# Patient Record
Sex: Male | Born: 1990 | Race: White | Hispanic: No | Marital: Married | Smoking: Never smoker
Health system: Southern US, Community
[De-identification: ages and names within clinical notes are randomized; demographics above are authoritative.]

## PROBLEM LIST (undated history)

## (undated) DIAGNOSIS — K5792 Diverticulitis of intestine, part unspecified, without perforation or abscess without bleeding: Secondary | ICD-10-CM

## (undated) DIAGNOSIS — L309 Dermatitis, unspecified: Secondary | ICD-10-CM

## (undated) DIAGNOSIS — I10 Essential (primary) hypertension: Secondary | ICD-10-CM

## (undated) DIAGNOSIS — J45909 Unspecified asthma, uncomplicated: Secondary | ICD-10-CM

## (undated) DIAGNOSIS — F419 Anxiety disorder, unspecified: Secondary | ICD-10-CM

## (undated) HISTORY — DX: Dermatitis, unspecified: L30.9

## (undated) HISTORY — DX: Anxiety disorder, unspecified: F41.9

## (undated) HISTORY — DX: Diverticulitis of intestine, part unspecified, without perforation or abscess without bleeding: K57.92

## (undated) HISTORY — PX: APPENDECTOMY: SHX54

## (undated) HISTORY — DX: Unspecified asthma, uncomplicated: J45.909

## (undated) HISTORY — DX: Essential (primary) hypertension: I10

---
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
## 2015-10-06 ENCOUNTER — Telehealth: Payer: Self-pay | Admitting: Family Medicine

## 2015-10-06 MED ORDER — OSELTAMIVIR PHOSPHATE 75 MG PO CAPS: 75.0000 mg | ORAL_CAPSULE | Freq: Every day | ORAL | Status: DC

## 2015-10-06 NOTE — Telephone Encounter (Signed)
Mom diagnosed with flu. Needs prophylactic medicine. Rx sent to his pharmacy.

## 2015-11-11 ENCOUNTER — Encounter: Payer: Self-pay | Admitting: Family Medicine

## 2015-11-11 ENCOUNTER — Ambulatory Visit (INDEPENDENT_AMBULATORY_CARE_PROVIDER_SITE_OTHER): Payer: 59 | Admitting: Family Medicine

## 2015-11-11 VITALS — BP 131/92 | HR 75 | Temp 98.6°F | Ht 71.3 in | Wt 206.0 lb

## 2015-11-11 DIAGNOSIS — J029 Acute pharyngitis, unspecified: Secondary | ICD-10-CM

## 2015-11-11 MED ORDER — FIRST-DUKES MOUTHWASH MT SUSP: 5.0000 mL | OROMUCOSAL | Status: DC | PRN

## 2015-11-11 MED ORDER — HYDROCOD POLST-CPM POLST ER 10-8 MG/5ML PO SUER: 5.0000 mL | Freq: Every evening | ORAL | Status: DC | PRN

## 2015-11-11 NOTE — Progress Notes (Signed)
BP 131/92 mmHg  Pulse 75  Temp(Src) 98.6 F (37 C)  Ht 5' 11.3" (1.811 m)  Wt 206 lb (93.441 kg)  BMI 28.49 kg/m2  SpO2 96%   Subjective:    Patient ID: George Rodriguez, male    DOB: 1991/06/22, 25 y.o.   MRN: [id number]  HPI: George Rodriguez is a 25 y.o. male  Chief Complaint  Patient presents with  . Sore Throat   UPPER RESPIRATORY TRACT INFECTION Duration: 3 days Worst symptom: sore throat Fever: no Cough: yes Shortness of breath: no Wheezing: no Chest pain: no Chest tightness: no Chest congestion: no Nasal congestion: yes Runny nose: yes Post nasal drip: yes Sneezing: yes Sore throat: yes Swollen glands: yes Sinus pressure: yes Headache: no Face pain: no Toothache: no Ear pain: no  Ear pressure: no  Eyes red/itching:yes Eye drainage/crusting: no  Vomiting: no Rash: no Fatigue: no Sick contacts: no Strep contacts: no  Context: stable Recurrent sinusitis: no Relief with OTC cold/cough medications: no  Treatments attempted: aleve   Relevant past medical, surgical, family and social history reviewed and updated as indicated. Interim medical history since our last visit reviewed. Allergies and medications reviewed and updated.  Review of Systems  Constitutional: Negative.   HENT: Positive for congestion, postnasal drip, rhinorrhea, sinus pressure, sneezing and sore throat. Negative for dental problem, drooling, ear discharge, ear pain, facial swelling, hearing loss, mouth sores, nosebleeds, tinnitus, trouble swallowing and voice change.   Respiratory: Negative.   Cardiovascular: Negative.   Psychiatric/Behavioral: Negative.     Per HPI unless specifically indicated above     Objective:    BP 131/92 mmHg  Pulse 75  Temp(Src) 98.6 F (37 C)  Ht 5' 11.3" (1.811 m)  Wt 206 lb (93.441 kg)  BMI 28.49 kg/m2  SpO2 96%  Wt Readings from Last 3 Encounters:  11/11/15 206 lb (93.441 kg)    Physical Exam  Constitutional: He is oriented to person, place,  and time. He appears well-developed and well-nourished. No distress.  HENT:  Head: Normocephalic and atraumatic.  Right Ear: Hearing, tympanic membrane, external ear and ear canal normal.  Left Ear: Hearing, tympanic membrane, external ear and ear canal normal.  Nose: Mucosal edema and rhinorrhea present.  Mouth/Throat: Uvula is midline and mucous membranes are normal. Oropharyngeal exudate and posterior oropharyngeal erythema present. No posterior oropharyngeal edema or tonsillar abscesses.  Eyes: Conjunctivae and lids are normal. Right eye exhibits no discharge. Left eye exhibits no discharge. No scleral icterus.  Neck: Normal range of motion. Neck supple. No JVD present. No tracheal deviation present. No thyromegaly present.  Cardiovascular: Normal rate, regular rhythm, normal heart sounds and intact distal pulses.  Exam reveals no gallop and no friction rub.   No murmur heard. Pulmonary/Chest: Effort normal and breath sounds normal. No stridor. No respiratory distress. He has no wheezes. He has no rales. He exhibits no tenderness.  Musculoskeletal: Normal range of motion.  Lymphadenopathy:    He has cervical adenopathy.  Neurological: He is alert and oriented to person, place, and time.  Skin: Skin is warm, dry and intact. No rash noted. He is not diaphoretic. No erythema.  Psychiatric: He has a normal mood and affect. His speech is normal and behavior is normal. Judgment and thought content normal. Cognition and memory are normal.  Nursing note and vitals reviewed.   No results found for this or any previous visit.    Assessment & Plan:   Problem List Items Addressed This Visit  None    Visit Diagnoses    Sore throat    -  Primary    Will treat with [facility] magic mouth wash and tussionex for comfort. Call if not getting better or getting worse.     Relevant Orders    Rapid strep screen (not [facility])        Follow up plan: Return if symptoms worsen or fail to  improve.

## 2015-11-13 LAB — CULTURE, GROUP A STREP: Strep A Culture: NEGATIVE

## 2015-11-13 LAB — RAPID STREP SCREEN (MED CTR MEBANE ONLY): Strep Gp A Ag, IA W/Reflex: NEGATIVE

## 2017-03-10 ENCOUNTER — Emergency Department
Admission: EM | Admit: 2017-03-10 | Discharge: 2017-03-10 | Disposition: A | Discharge status: Home / Self Care | Payer: No Typology Code available for payment source | Attending: Emergency Medicine | Admitting: Emergency Medicine

## 2017-03-10 ENCOUNTER — Encounter: Payer: Self-pay | Admitting: Emergency Medicine

## 2017-03-10 ENCOUNTER — Telehealth: Payer: Self-pay | Admitting: Family Medicine

## 2017-03-10 DIAGNOSIS — Y929 Unspecified place or not applicable: Secondary | ICD-10-CM | POA: Diagnosis not present

## 2017-03-10 DIAGNOSIS — M791 Myalgia: Secondary | ICD-10-CM | POA: Insufficient documentation

## 2017-03-10 DIAGNOSIS — M542 Cervicalgia: Secondary | ICD-10-CM | POA: Diagnosis present

## 2017-03-10 DIAGNOSIS — M545 Low back pain: Secondary | ICD-10-CM | POA: Insufficient documentation

## 2017-03-10 DIAGNOSIS — W2210XA Striking against or struck by unspecified automobile airbag, initial encounter: Secondary | ICD-10-CM | POA: Insufficient documentation

## 2017-03-10 DIAGNOSIS — M7918 Myalgia, other site: Secondary | ICD-10-CM

## 2017-03-10 DIAGNOSIS — F17220 Nicotine dependence, chewing tobacco, uncomplicated: Secondary | ICD-10-CM | POA: Diagnosis not present

## 2017-03-10 DIAGNOSIS — Y9389 Activity, other specified: Secondary | ICD-10-CM | POA: Insufficient documentation

## 2017-03-10 DIAGNOSIS — Y998 Other external cause status: Secondary | ICD-10-CM | POA: Insufficient documentation

## 2017-03-10 MED ORDER — CYCLOBENZAPRINE HCL 5 MG PO TABS: 5.0000 mg | ORAL_TABLET | Freq: Three times a day (TID) | ORAL | 0 refills | Status: AC | PRN

## 2017-03-10 MED ORDER — LIDOCAINE 5 % EX PTCH: 1.0000 | MEDICATED_PATCH | Freq: Two times a day (BID) | CUTANEOUS | 0 refills | Status: DC

## 2017-03-10 MED ORDER — IBUPROFEN 800 MG PO TABS: 800.0000 mg | ORAL_TABLET | Freq: Three times a day (TID) | ORAL | 0 refills | Status: DC | PRN

## 2017-03-10 MED ORDER — LIDOCAINE 5 % EX PTCH
1.0000 | MEDICATED_PATCH | CUTANEOUS | Status: DC
  Administered 2017-03-10: 1 via TRANSDERMAL
  Filled 2017-03-10: qty 1
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
## 2017-03-10 NOTE — ED Notes (Signed)

## 2017-03-10 NOTE — Telephone Encounter (Signed)
FYI
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
## 2017-03-15 ENCOUNTER — Ambulatory Visit (INDEPENDENT_AMBULATORY_CARE_PROVIDER_SITE_OTHER): Payer: BLUE CROSS/BLUE SHIELD | Admitting: Family Medicine

## 2017-03-15 ENCOUNTER — Encounter: Payer: Self-pay | Admitting: Family Medicine

## 2017-03-15 VITALS — BP 132/88 | HR 84 | Temp 97.6°F | Ht 72.25 in | Wt 215.0 lb

## 2017-03-15 DIAGNOSIS — M545 Low back pain, unspecified: Secondary | ICD-10-CM

## 2017-03-15 DIAGNOSIS — M25532 Pain in left wrist: Secondary | ICD-10-CM

## 2017-03-15 MED ORDER — PREDNISONE 20 MG PO TABS
40.0000 mg | ORAL_TABLET | Freq: Every day | ORAL | 0 refills | Status: DC
Start: 2017-03-15 — End: 2017-07-13
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
## 2017-03-16 NOTE — Patient Instructions (Signed)
Follow up as needed

## 2017-05-05 ENCOUNTER — Encounter: Payer: Self-pay | Admitting: Family Medicine

## 2017-05-05 ENCOUNTER — Ambulatory Visit (INDEPENDENT_AMBULATORY_CARE_PROVIDER_SITE_OTHER): Payer: BLUE CROSS/BLUE SHIELD | Admitting: Family Medicine

## 2017-05-05 VITALS — BP 142/70 | HR 79 | Temp 98.2°F | Ht 72.0 in | Wt 220.4 lb

## 2017-05-05 DIAGNOSIS — R030 Elevated blood-pressure reading, without diagnosis of hypertension: Secondary | ICD-10-CM

## 2017-05-05 NOTE — Progress Notes (Signed)
   BP (!) 142/70   Pulse 79   Temp 98.2 F (36.8 C)   Ht 6' (1.829 m)   Wt 220 lb 6.4 oz (100 kg)   SpO2 99%   BMI 29.89 kg/m    Subjective:    Patient ID: George Rodriguez, male    DOB: 11-29-1990, 26 y.o.   MRN: [id number]  HPI: George Rodriguez is a 26 y.o. male  Chief Complaint  Patient presents with  . Hypertension    Went to allergist this morning, BP was around 150/100.    Patient presents today by advice of his Allergist who found a BP of 150/105 at his appt this morning. Pt asymptomatic, no HA, CP, dizziness, visual disturbances. States he was rushing around and late to the appointment. No prior personal hx of HTN, but significant fhx per mother. Does not he no longer exercises and that his diet is very poor these days. Also states he drinks lots of energy drinks and chews tobacco.   Relevant past medical, surgical, family and social history reviewed and updated as indicated. Interim medical history since our last visit reviewed. Allergies and medications reviewed and updated.  Review of Systems  Constitutional: Negative.   Respiratory: Negative.   Cardiovascular: Negative.   Musculoskeletal: Negative.   Neurological: Negative.   Psychiatric/Behavioral: Negative.     Per HPI unless specifically indicated above     Objective:    BP (!) 142/70   Pulse 79   Temp 98.2 F (36.8 C)   Ht 6' (1.829 m)   Wt 220 lb 6.4 oz (100 kg)   SpO2 99%   BMI 29.89 kg/m   Wt Readings from Last 3 Encounters:  05/05/17 220 lb 6.4 oz (100 kg)  03/15/17 215 lb (97.5 kg)  03/10/17 210 lb (95.3 kg)    Physical Exam  Constitutional: He is oriented to person, place, and time. He appears well-developed and well-nourished. No distress.  HENT:  Head: Atraumatic.  Eyes: Pupils are equal, round, and reactive to light. Conjunctivae are normal.  Neck: Normal range of motion. Neck supple.  Cardiovascular: Normal rate and normal heart sounds.   Pulmonary/Chest: Effort normal and breath  sounds normal.  Musculoskeletal: Normal range of motion.  Neurological: He is alert and oriented to person, place, and time.  Skin: Skin is warm and dry.  Psychiatric: He has a normal mood and affect. His behavior is normal.  Nursing note and vitals reviewed.     Assessment & Plan:   Problem List Items Addressed This Visit    None    Visit Diagnoses    Elevated blood pressure reading    -  Primary   Has come down to near normal range. Discussed monitoring closely without meds, diet and lifestyle changs. Will get a physical in 3 months and re-evaluate then       Follow up plan: Return in about 3 months (around 08/04/2017) for CPE, HTN.

## 2017-05-06 NOTE — Patient Instructions (Signed)
Follow up in 3 months

## 2017-07-13 ENCOUNTER — Encounter: Payer: Self-pay | Admitting: Family Medicine

## 2017-07-13 ENCOUNTER — Ambulatory Visit: Payer: BLUE CROSS/BLUE SHIELD | Admitting: Family Medicine

## 2017-07-13 VITALS — BP 156/97 | HR 90 | Temp 97.8°F | Wt 218.4 lb

## 2017-07-13 DIAGNOSIS — J069 Acute upper respiratory infection, unspecified: Secondary | ICD-10-CM | POA: Diagnosis not present

## 2017-07-13 DIAGNOSIS — J309 Allergic rhinitis, unspecified: Secondary | ICD-10-CM

## 2017-07-13 DIAGNOSIS — J45909 Unspecified asthma, uncomplicated: Secondary | ICD-10-CM | POA: Insufficient documentation

## 2017-07-13 DIAGNOSIS — J4540 Moderate persistent asthma, uncomplicated: Secondary | ICD-10-CM

## 2017-07-13 MED ORDER — AZITHROMYCIN 250 MG PO TABS: ORAL_TABLET | ORAL | 0 refills | Status: DC

## 2017-07-13 MED ORDER — HYDROCOD POLST-CPM POLST ER 10-8 MG/5ML PO SUER: 5.0000 mL | Freq: Two times a day (BID) | ORAL | 0 refills | Status: DC | PRN

## 2017-07-13 NOTE — Progress Notes (Signed)
   BP (!) 156/97   Pulse 90   Temp 97.8 F (36.6 C) (Oral)   Wt 218 lb 6.4 oz (99.1 kg)   SpO2 98%   BMI 29.62 kg/m    Subjective:    Patient ID: George Rodriguez, male    DOB: 09/29/1990, 26 y.o.   MRN: [id number]  HPI: George Rodriguez is a 26 y.o. male  Chief Complaint  Patient presents with  . Cough    pt states he has had a dry cough for about 5 days, states his girlfriend was sick about 3 weeks ago with URI   Patient presents with congestion, hacking cough, chest tightness, fatigue x 1 week. Has not been trying anything OTC for sxs. Cough is worst in mornings and nighttime. Girlfriend recently sick with same sxs. Hx of asthma and allergies, compliant with current regimen.   Relevant past medical, surgical, family and social history reviewed and updated as indicated. Interim medical history since our last visit reviewed. Allergies and medications reviewed and updated.  Review of Systems  Constitutional: Positive for fatigue.  HENT: Positive for congestion.   Eyes: Negative.   Respiratory: Positive for cough and chest tightness.   Cardiovascular: Negative.   Gastrointestinal: Negative.   Genitourinary: Negative.   Musculoskeletal: Negative.   Neurological: Negative.   Psychiatric/Behavioral: Negative.    Per HPI unless specifically indicated above     Objective:    BP (!) 156/97   Pulse 90   Temp 97.8 F (36.6 C) (Oral)   Wt 218 lb 6.4 oz (99.1 kg)   SpO2 98%   BMI 29.62 kg/m   Wt Readings from Last 3 Encounters:  07/13/17 218 lb 6.4 oz (99.1 kg)  05/05/17 220 lb 6.4 oz (100 kg)  03/15/17 215 lb (97.5 kg)    Physical Exam  Constitutional: He is oriented to person, place, and time. He appears well-developed and well-nourished. No distress.  HENT:  Head: Atraumatic.  Right Ear: External ear normal.  Left Ear: External ear normal.  Nasal mucosa and oropharynx erythematous  Eyes: Conjunctivae are normal. Pupils are equal, round, and reactive to light.  Neck:  Normal range of motion. Neck supple.  Cardiovascular: Normal rate and normal heart sounds.  Pulmonary/Chest: Effort normal and breath sounds normal. No respiratory distress. He has no wheezes.  Musculoskeletal: Normal range of motion.  Lymphadenopathy:    He has no cervical adenopathy.  Neurological: He is alert and oriented to person, place, and time.  Skin: Skin is warm and dry.  Psychiatric: He has a normal mood and affect. His behavior is normal.  Nursing note and vitals reviewed.     Assessment & Plan:   Problem List Items Addressed This Visit      Respiratory   Asthma    Lungs clear today, continue inhaler and allergy regimen. F/u if becoming wheezy or SOB      Allergic rhinitis    Continue current regimen, add plain mucinex.        Other Visit Diagnoses    Upper respiratory tract infection, unspecified type    -  Primary   Will start zpack and tussionex, delsym for daytime cough relief. Continue current asthma and allergy regimen additionally   Relevant Medications   azithromycin (ZITHROMAX) 250 MG tablet      Follow up plan: Return if symptoms worsen or fail to improve.

## 2017-07-13 NOTE — Patient Instructions (Signed)
Follow up as needed

## 2017-07-13 NOTE — Assessment & Plan Note (Signed)
Continue current regimen, add plain mucinex.

## 2017-07-13 NOTE — Assessment & Plan Note (Signed)
Lungs clear today, continue inhaler and allergy regimen. F/u if becoming wheezy or SOB

## 2017-07-19 ENCOUNTER — Encounter: Payer: Self-pay | Admitting: Family Medicine

## 2017-08-04 ENCOUNTER — Ambulatory Visit: Payer: BLUE CROSS/BLUE SHIELD | Admitting: Family Medicine

## 2018-09-08 ENCOUNTER — Ambulatory Visit: Payer: BC Managed Care – PPO | Admitting: Family Medicine

## 2018-09-08 ENCOUNTER — Encounter: Payer: Self-pay | Admitting: Family Medicine

## 2018-09-08 VITALS — BP 139/85 | HR 96 | Temp 100.7°F | Wt 220.8 lb

## 2018-09-08 DIAGNOSIS — J4541 Moderate persistent asthma with (acute) exacerbation: Secondary | ICD-10-CM

## 2018-09-08 DIAGNOSIS — J101 Influenza due to other identified influenza virus with other respiratory manifestations: Secondary | ICD-10-CM | POA: Diagnosis not present

## 2018-09-08 LAB — VERITOR FLU A/B WAIVED
INFLUENZA B: NEGATIVE
Influenza A: POSITIVE — AB

## 2018-09-08 MED ORDER — BREO ELLIPTA 100-25 MCG/INH IN AEPB: 1.0000 | INHALATION_SPRAY | Freq: Every day | RESPIRATORY_TRACT | 0 refills | Status: DC

## 2018-09-08 MED ORDER — BALOXAVIR MARBOXIL(80 MG DOSE) 2 X 40 MG PO TBPK: 80.0000 mg | ORAL_TABLET | Freq: Once | ORAL | 0 refills | Status: AC

## 2018-09-08 MED ORDER — HYDROCOD POLST-CPM POLST ER 10-8 MG/5ML PO SUER: 5.0000 mL | Freq: Two times a day (BID) | ORAL | 0 refills | Status: DC | PRN

## 2018-09-08 NOTE — Progress Notes (Signed)
BP 139/85   Pulse 96   Temp (!) 100.7 F (38.2 C) (Oral)   Wt 220 lb 12.8 oz (100.2 kg)   SpO2 95%   BMI 29.95 kg/m    Subjective:    Patient ID: George Rodriguez, male    DOB: [birth year], 28 y.o.   MRN: [id number]  HPI: George Rodriguez is a 28 y.o. male  Chief Complaint  Patient presents with  . URI    pt states he has had a cough, congestion, chills, body aches, and fever since yesterday   Sudden onset of congestion, productive cough, chills, sweats, generalized body aches, high fevers x 1 day. Taking OTC cold and flu medications with minimal relief. No recent sick contacts. Hx of allergies and asthma currently on allergy regimen and albuterol prn.   Relevant past medical, surgical, family and social history reviewed and updated as indicated. Interim medical history since our last visit reviewed. Allergies and medications reviewed and updated.  Review of Systems  Per HPI unless specifically indicated above     Objective:    BP 139/85   Pulse 96   Temp (!) 100.7 F (38.2 C) (Oral)   Wt 220 lb 12.8 oz (100.2 kg)   SpO2 95%   BMI 29.95 kg/m   Wt Readings from Last 3 Encounters:  09/08/18 220 lb 12.8 oz (100.2 kg)  07/13/17 218 lb 6.4 oz (99.1 kg)  05/05/17 220 lb 6.4 oz (100 kg)    Physical Exam Vitals signs and nursing note reviewed.  Constitutional:      Appearance: He is well-developed. He is ill-appearing.  HENT:     Head: Atraumatic.     Right Ear: External ear normal.     Left Ear: External ear normal.     Nose: Rhinorrhea present.     Mouth/Throat:     Pharynx: Posterior oropharyngeal erythema present. No oropharyngeal exudate.  Eyes:     Conjunctiva/sclera: Conjunctivae normal.     Pupils: Pupils are equal, round, and reactive to light.  Neck:     Musculoskeletal: Normal range of motion and neck supple.  Cardiovascular:     Rate and Rhythm: Normal rate and regular rhythm.  Pulmonary:     Effort: Pulmonary effort is normal. No respiratory distress.      Breath sounds: Wheezing present. No rales.  Musculoskeletal: Normal range of motion.  Lymphadenopathy:     Cervical: No cervical adenopathy.  Skin:    General: Skin is warm and dry.  Neurological:     Mental Status: He is alert and oriented to person, place, and time.  Psychiatric:        Behavior: Behavior normal.     Results for orders placed or performed in visit on 11/11/15  Rapid strep screen (not at [facility])  Result Value Ref Range   Strep Gp A Ag, IA W/Reflex Negative Negative  Culture, Group A Strep  Result Value Ref Range   Strep A Culture Negative       Assessment & Plan:   Problem List Items Addressed This Visit      Respiratory   Asthma    Exacerbated by influenza. Restart breo for the next few weeks. Continue prn albuterol      Relevant Medications   BREO ELLIPTA 100-25 MCG/INH AEPB    Other Visit Diagnoses    Influenza A    -  Primary   Tx with xofluza, tussionex, supportive home care. Sedation precautions reviewed. F/u if worsening or  not improving   Relevant Medications   Baloxavir Marboxil,80 MG Dose, (XOFLUZA) 2 x 40 MG TBPK   Other Relevant Orders   Veritor Flu A/B Waived       Follow up plan: Return if symptoms worsen or fail to improve.

## 2018-09-08 NOTE — Assessment & Plan Note (Signed)
Exacerbated by influenza. Restart breo for the next few weeks. Continue prn albuterol

## 2018-12-23 ENCOUNTER — Other Ambulatory Visit: Payer: Self-pay | Admitting: Family Medicine

## 2018-12-23 NOTE — Telephone Encounter (Signed)
Requested Prescriptions  Pending Prescriptions Disp Refills  . BREO ELLIPTA 100-25 MCG/INH AEPB [Pharmacy Med Name: BREO ELLIPTA 100-25 MCG INH] 60 each 0    Sig: TAKE 1 PUFF BY MOUTH EVERY DAY     Pulmonology:  Combination Products Passed - 12/23/2018 11:41 AM      Passed - Valid encounter within last 12 months    Recent Outpatient Visits          3 months ago Influenza A   [facility] [place], [place]   1 year ago Upper respiratory tract infection, unspecified type   [facility] Roosvelt Maser [place], [place]   1 year ago Elevated blood pressure reading   [facility], [place], [place]   1 year ago Acute left-sided low back pain without sciatica   [facility] Part[place], [place]   3 years ago Sore throat   [facility] [place], [place], [place]

## 2019-01-19 ENCOUNTER — Other Ambulatory Visit: Payer: Self-pay | Admitting: Family Medicine

## 2019-01-19 NOTE — Telephone Encounter (Signed)
Needs appointment

## 2019-01-19 NOTE — Telephone Encounter (Signed)
Pt scheduled for Tuesday

## 2019-01-19 NOTE — Telephone Encounter (Signed)
Forwarding medication refill to PCP for review. 

## 2019-01-23 ENCOUNTER — Ambulatory Visit (INDEPENDENT_AMBULATORY_CARE_PROVIDER_SITE_OTHER): Payer: BC Managed Care – PPO | Admitting: Family Medicine

## 2019-01-23 ENCOUNTER — Other Ambulatory Visit: Payer: Self-pay

## 2019-01-23 ENCOUNTER — Encounter: Payer: Self-pay | Admitting: Family Medicine

## 2019-01-23 DIAGNOSIS — J4540 Moderate persistent asthma, uncomplicated: Secondary | ICD-10-CM

## 2019-01-23 MED ORDER — BREO ELLIPTA 100-25 MCG/INH IN AEPB: INHALATION_SPRAY | RESPIRATORY_TRACT | 1 refills | Status: DC

## 2019-01-23 MED ORDER — PROAIR HFA 108 (90 BASE) MCG/ACT IN AERS: 1.0000 | INHALATION_SPRAY | Freq: Four times a day (QID) | RESPIRATORY_TRACT | 1 refills | Status: DC | PRN

## 2019-01-23 NOTE — Assessment & Plan Note (Signed)
Under good control on current regimen. Continue current regimen. Continue to monitor. Call with any concerns. Refills given.   

## 2019-01-23 NOTE — Progress Notes (Signed)
Temp 98.6 F (37 C)    Subjective:    Patient ID: George Rodriguez, male    DOB: 08/24/1990, 28 y.o.   MRN: [id number]  HPI: George Rodriguez is a 28 y.o. male  Chief Complaint  Patient presents with  . Asthma    Proair refill   ASTHMA- doing well on the breo, only needing to use his proair 1x every 1-2 weeks. Asthma status: better Satisfied with current treatment?: no Albuterol/rescue inhaler frequency: 1x every 1-2 weeks. Dyspnea frequency: rarely Wheezing frequency: rarely Cough frequency: rarely Nocturnal symptom frequency: none  Limitation of activity: no Current upper respiratory symptoms: no Visits to ER or Urgent Care in past year: no Pneumovax: Not up to Date Influenza: Up to Date  Relevant past medical, surgical, family and social history reviewed and updated as indicated. Interim medical history since our last visit reviewed. Allergies and medications reviewed and updated.  Review of Systems  Constitutional: Negative.   HENT: Negative.   Respiratory: Negative.   Cardiovascular: Negative.   Neurological: Negative.   Psychiatric/Behavioral: Negative.     Per HPI unless specifically indicated above     Objective:    Temp 98.6 F (37 C)   Wt Readings from Last 3 Encounters:  09/08/18 220 lb 12.8 oz (100.2 kg)  07/13/17 218 lb 6.4 oz (99.1 kg)  05/05/17 220 lb 6.4 oz (100 kg)    Physical Exam Vitals signs and nursing note reviewed.  Constitutional:      General: He is not in acute distress.    Appearance: Normal appearance. He is not ill-appearing, toxic-appearing or diaphoretic.  HENT:     Head: Normocephalic and atraumatic.     Right Ear: External ear normal.     Left Ear: External ear normal.     Nose: Nose normal.     Mouth/Throat:     Mouth: Mucous membranes are moist.     Pharynx: Oropharynx is clear.  Eyes:     General: No scleral icterus.       Right eye: No discharge.        Left eye: No discharge.     Conjunctiva/sclera: Conjunctivae  normal.     Pupils: Pupils are equal, round, and reactive to light.  Neck:     Musculoskeletal: Normal range of motion.  Pulmonary:     Effort: Pulmonary effort is normal. No respiratory distress.     Comments: Speaking in full sentences Musculoskeletal: Normal range of motion.  Skin:    Coloration: Skin is not jaundiced or pale.     Findings: No bruising, erythema, lesion or rash.  Neurological:     Mental Status: He is alert and oriented to person, place, and time. Mental status is at baseline.  Psychiatric:        Mood and Affect: Mood normal.        Behavior: Behavior normal.        Thought Content: Thought content normal.        Judgment: Judgment normal.     Results for orders placed or performed in visit on 09/08/18  Veritor Flu A/B Waived  Result Value Ref Range   Influenza A Positive (A) Negative   Influenza B Negative Negative      Assessment & Plan:   Problem List Items Addressed This Visit      Respiratory   Asthma    Under good control on current regimen. Continue current regimen. Continue to monitor. Call with any concerns. Refills  given.        Relevant Medications   PROAIR HFA 108 (90 Base) MCG/ACT inhaler   BREO ELLIPTA 100-25 MCG/INH AEPB       Follow up plan: Return in about 6 months (around 07/25/2019) for Physical.

## 2019-08-26 NOTE — Progress Notes (Deleted)
There were no vitals taken for this visit.   Subjective:    Patient ID: George Rodriguez, male    DOB: 09-23-1990, 29 y.o.   MRN: [id number]  HPI: George Rodriguez is a 29 y.o. male presenting on 08/27/2019 for comprehensive medical examination. Current medical complaints include:{Blank single:19197::"none","***"}  He currently lives with: Interim Problems from his last visit: {Blank single:19197::"yes","no"}  Depression Screen done today and results listed below:  No flowsheet data found.  The patient {has/does not have:19849} a history of falls. I {did/did not:19850} complete a risk assessment for falls. A plan of care for falls {was/was not:19852} documented.   Past Medical History:  Past Medical History:  Diagnosis Date  . Anxiety   . Asthma   . Eczema     Surgical History:  Past Surgical History:  Procedure Laterality Date  . APPENDECTOMY      Medications:  Current Outpatient Medications on File Prior to Visit  Medication Sig  . BREO ELLIPTA 100-25 MCG/INH AEPB INHALE 1 PUFF BY MOUTH EVERY DAY  . DUPIXENT 300 MG/2ML SOSY 2 shots a month  . PROAIR HFA 108 (90 Base) MCG/ACT inhaler Inhale 1-2 puffs into the lungs every 6 (six) hours as needed for wheezing or shortness of breath.  . triamcinolone (NASACORT) 55 MCG/ACT AERO nasal inhaler Place 2 sprays into the nose daily.   No current facility-administered medications on file prior to visit.    Allergies:  No Known Allergies  Social History:  Social History   Socioeconomic History  . Marital status: Single    Spouse name: Not on file  . Number of children: Not on file  . Years of education: Not on file  . Highest education level: Not on file  Occupational History  . Not on file  Tobacco Use  . Smoking status: Never Smoker  . Smokeless tobacco: Current User    Types: Chew  Substance and Sexual Activity  . Alcohol use: Yes    Alcohol/week: 16.0 standard drinks    Types: 12 Cans of beer, 4 Shots of liquor  per week  . Drug use: No  . Sexual activity: Never  Other Topics Concern  . Not on file  Social History Narrative  . Not on file   Social Determinants of Health   Financial Resource Strain:   . Difficulty of Paying Living Expenses: Not on file  Food Insecurity:   . Worried About Programme researcher, broadcasting/film/video in the Last Year: Not on file  . Ran Out of Food in the Last Year: Not on file  Transportation Needs:   . Lack of Transportation (Medical): Not on file  . Lack of Transportation (Non-Medical): Not on file  Physical Activity:   . Days of Exercise per Week: Not on file  . Minutes of Exercise per Session: Not on file  Stress:   . Feeling of Stress : Not on file  Social Connections:   . Frequency of Communication with Friends and Family: Not on file  . Frequency of Social Gatherings with Friends and Family: Not on file  . Attends Religious Services: Not on file  . Active Member of Clubs or Organizations: Not on file  . Attends Banker Meetings: Not on file  . Marital Status: Not on file  Intimate Partner Violence:   . Fear of Current or Ex-Partner: Not on file  . Emotionally Abused: Not on file  . Physically Abused: Not on file  . Sexually Abused: Not on  file   Social History   Tobacco Use  Smoking Status Never Smoker  Smokeless Tobacco Current User  . Types: Chew   Social History   Substance and Sexual Activity  Alcohol Use Yes  . Alcohol/week: 16.0 standard drinks  . Types: 12 Cans of beer, 4 Shots of liquor per week    Family History:  Family History  Problem Relation Age of Onset  . Hyperlipidemia Mother   . Hypertension Father   . Heart attack Paternal Grandmother   . Alcohol abuse Paternal Grandfather     Past medical history, surgical history, medications, allergies, family history and social history reviewed with patient today and changes made to appropriate areas of the chart.   Review of Systems - {ros master:310782} All other ROS negative  except what is listed above and in the HPI.      Objective:    There were no vitals taken for this visit.  Wt Readings from Last 3 Encounters:  09/08/18 220 lb 12.8 oz (100.2 kg)  07/13/17 218 lb 6.4 oz (99.1 kg)  05/05/17 220 lb 6.4 oz (100 kg)    Physical Exam  Results for orders placed or performed in visit on 09/08/18  Veritor Flu A/B Waived  Result Value Ref Range   Influenza A Positive (A) Negative   Influenza B Negative Negative      Assessment & Plan:   Problem List Items Addressed This Visit    None    Visit Diagnoses    Routine general medical examination at a health care facility    -  Primary       Discussed aspirin prophylaxis for myocardial infarction prevention and decision was {Blank single:19197::"it was not indicated","made to continue ASA","made to start ASA","made to stop ASA","that we recommended ASA, and patient refused"}  LABORATORY TESTING:  Health maintenance labs ordered today as discussed above.   The natural history of prostate cancer and ongoing controversy regarding screening and potential treatment outcomes of prostate cancer has been discussed with the patient. The meaning of a false positive PSA and a false negative PSA has been discussed. He indicates understanding of the limitations of this screening test and wishes *** to proceed with screening PSA testing.   IMMUNIZATIONS:   - Tdap: Tetanus vaccination status reviewed: {tetanus status:315746}. - Influenza: {Blank single:19197::"Up to date","Administered today","Postponed to flu season","Refused","Given elsewhere"} - Pneumovax: {Blank single:19197::"Up to date","Administered today","Not applicable","Refused","Given elsewhere"} - Prevnar: {Blank single:19197::"Up to date","Administered today","Not applicable","Refused","Given elsewhere"} - HPV: {Blank single:19197::"Up to date","Administered today","Not applicable","Refused","Given elsewhere"} - Zostavax vaccine: {Blank single:19197::"Up  to date","Administered today","Not applicable","Refused","Given elsewhere"}  SCREENING: - Colonoscopy: {Blank single:19197::"Up to date","Ordered today","Not applicable","Refused","Done elsewhere"}  Discussed with patient purpose of the colonoscopy is to detect colon cancer at curable precancerous or early stages   - AAA Screening: {Blank single:19197::"Up to date","Ordered today","Not applicable","Refused","Done elsewhere"}  -Hearing Test: {Blank single:19197::"Up to date","Ordered today","Not applicable","Refused","Done elsewhere"}  -Spirometry: {Blank single:19197::"Up to date","Ordered today","Not applicable","Refused","Done elsewhere"}   PATIENT COUNSELING:    Sexuality: Discussed sexually transmitted diseases, partner selection, use of condoms, avoidance of unintended pregnancy  and contraceptive alternatives.   Advised to avoid cigarette smoking.  I discussed with the patient that most people either abstain from alcohol or drink within safe limits (<=14/week and <=4 drinks/occasion for males, <=7/weeks and <= 3 drinks/occasion for females) and that the risk for alcohol disorders and other health effects rises proportionally with the number of drinks per week and how often a drinker exceeds daily limits.  Discussed cessation/primary prevention  of drug use and availability of treatment for abuse.   Diet: Encouraged to adjust caloric intake to maintain  or achieve ideal body weight, to reduce intake of dietary saturated fat and total fat, to limit sodium intake by avoiding high sodium foods and not adding table salt, and to maintain adequate dietary potassium and calcium preferably from fresh fruits, vegetables, and low-fat dairy products.    stressed the importance of regular exercise  Injury prevention: Discussed safety belts, safety helmets, smoke detector, smoking near bedding or upholstery.   Dental health: Discussed importance of regular tooth brushing, flossing, and dental  visits.   Follow up plan: NEXT PREVENTATIVE PHYSICAL DUE IN 1 YEAR. No follow-ups on file.

## 2019-08-27 ENCOUNTER — Encounter: Payer: Self-pay | Admitting: Family Medicine

## 2019-09-07 ENCOUNTER — Other Ambulatory Visit: Payer: Self-pay | Admitting: Family Medicine

## 2019-09-07 NOTE — Telephone Encounter (Signed)
   Notes to clinic:  Alternative Requested:[facility] WILL NOT PAY FOR BRAND PROAIR WITHOUT PRIOR APPROVAL. PLEASE GET PRIOR AUTH OR SEND NEW RX WITH DAW 0   Requested Prescriptions  Pending Prescriptions Disp Refills   albuterol (VENTOLIN HFA) 108 (90 Base) MCG/ACT inhaler [Pharmacy Med Name: ALBUTEROL HFA 90 MCG INHALER]  0    Sig: Please specify directions, refills and quantity      Pulmonology:  Beta Agonists Failed - 09/07/2019 12:08 PM      Failed - One inhaler should last at least one month. If the patient is requesting refills earlier, contact the patient to check for uncontrolled symptoms.      Passed - Valid encounter within last 12 months    Recent Outpatient Visits           7 months ago Moderate persistent asthma without complication   [facility] [place], [place], [place]   12 months ago Influenza A   [facility] Roosvelt Maser [place], [place]   2 years ago Upper respiratory tract infection, unspecified type   [facility] Roosvelt Maser [place], [place]   2 years ago Elevated blood pressure reading   [facility], [place], [place]   2 years ago Acute left-sided low back pain without sciatica   [facility] Roosvelt Maser [place], [place]

## 2019-09-07 NOTE — Telephone Encounter (Signed)
Can the generic be sent in or does he need to come in for an office visit?

## 2019-09-08 NOTE — Telephone Encounter (Signed)
Due for physical.

## 2019-09-10 ENCOUNTER — Encounter: Payer: Self-pay | Admitting: Family Medicine

## 2019-09-10 NOTE — Telephone Encounter (Signed)
Routing to provider to refuse medication.

## 2019-09-10 NOTE — Telephone Encounter (Signed)
George Rodriguez sent letter to patient.

## 2019-09-10 NOTE — Telephone Encounter (Signed)
Invalid number

## 2019-09-10 NOTE — Telephone Encounter (Signed)
LVM for pt to call back. Will also mail letter for follow up.

## 2019-09-14 ENCOUNTER — Other Ambulatory Visit: Payer: Self-pay

## 2019-09-14 ENCOUNTER — Encounter: Payer: Self-pay | Admitting: Family Medicine

## 2019-09-14 ENCOUNTER — Ambulatory Visit (INDEPENDENT_AMBULATORY_CARE_PROVIDER_SITE_OTHER): Payer: BC Managed Care – PPO | Admitting: Family Medicine

## 2019-09-14 VITALS — Wt 230.0 lb

## 2019-09-14 DIAGNOSIS — R1030 Lower abdominal pain, unspecified: Secondary | ICD-10-CM | POA: Diagnosis not present

## 2019-09-14 NOTE — Progress Notes (Signed)
Wt 230 lb (104.3 kg)   BMI 31.19 kg/m    Subjective:    Patient ID: George Rodriguez, male    DOB: 10-22-1990, 29 y.o.   MRN: [id number]  HPI: George D Beilfuss is a 29 y.o. male  Chief Complaint  Patient presents with  . Abdominal Pain    bloated x about week    . This visit was completed via WebEx due to the restrictions of the COVID-19 pandemic. All issues as above were discussed and addressed. Physical exam was done as above through visual confirmation on WebEx. If it was felt that the patient should be evaluated in the office, they were directed there. The patient verbally consented to this visit. . Location of the patient: in parked car . Location of the provider: work . Those involved with this call:  . Provider: Merrie Roof, PA-C . CMA: Lesle Chris, [place] . Front Desk/Registration: Jill Side  . Time spent on call: 15 minutes with patient face to face via video conference. More than 50% of this time was spent in counseling and coordination of care. 5 minutes total spent in review of patient's record and preparation of their chart. I verified patient identity using two factors (patient name and date of birth). Patient consents verbally to being seen via telemedicine visit today.   About a week of of lower abdominal pain radiating up to ribs in the night when he lays down flat. Also feeling bloated fairly consistently. Some constipation, denies fevers, chills, N/V/D, sick contacts, recent travel. Not trying anything OTC for sxs.   Relevant past medical, surgical, family and social history reviewed and updated as indicated. Interim medical history since our last visit reviewed. Allergies and medications reviewed and updated.  Review of Systems  Per HPI unless specifically indicated above     Objective:    Wt 230 lb (104.3 kg)   BMI 31.19 kg/m   Wt Readings from Last 3 Encounters:  09/14/19 230 lb (104.3 kg)  09/08/18 220 lb 12.8 oz (100.2 kg)  07/13/17 218 lb 6.4 oz  (99.1 kg)    Physical Exam Vitals and nursing note reviewed.  Constitutional:      General: He is not in acute distress.    Appearance: Normal appearance.  HENT:     Head: Atraumatic.     Right Ear: External ear normal.     Left Ear: External ear normal.     Nose: Nose normal. No congestion.     Mouth/Throat:     Mouth: Mucous membranes are moist.     Pharynx: Oropharynx is clear.  Eyes:     Extraocular Movements: Extraocular movements intact.     Conjunctiva/sclera: Conjunctivae normal.  Pulmonary:     Effort: Pulmonary effort is normal. No respiratory distress.  Abdominal:     Comments: Unable to perform abdominal exam due to virtual nature of visit  Musculoskeletal:        General: Normal range of motion.     Cervical back: Normal range of motion.  Skin:    General: Skin is dry.     Findings: No erythema or rash.  Neurological:     Mental Status: He is oriented to person, place, and time.  Psychiatric:        Mood and Affect: Mood normal.        Thought Content: Thought content normal.        Judgment: Judgment normal.     Results for orders placed or performed  in visit on 09/08/18  Veritor Flu A/B Waived  Result Value Ref Range   Influenza A Positive (A) Negative   Influenza B Negative Negative      Assessment & Plan:   Problem List Items Addressed This Visit    None    Visit Diagnoses    Lower abdominal pain    -  Primary   Discussed BRAT diet, probiotics, prilosec prn. Will obtain U/A and CBC next week if not feeling better with conservative measures   Relevant Orders   UA/M w/rflx Culture, Routine   CBC With Differential/Platelet       Follow up plan: Return if symptoms worsen or fail to improve.

## 2019-10-25 ENCOUNTER — Other Ambulatory Visit: Payer: Self-pay

## 2019-10-25 ENCOUNTER — Encounter: Payer: Self-pay | Admitting: Dermatology

## 2019-10-25 ENCOUNTER — Ambulatory Visit: Payer: BC Managed Care – PPO | Admitting: Dermatology

## 2019-10-25 DIAGNOSIS — L209 Atopic dermatitis, unspecified: Secondary | ICD-10-CM

## 2019-10-25 MED ORDER — DUPIXENT 300 MG/2ML ~~LOC~~ SOAJ: 300.0000 mg | SUBCUTANEOUS | 6 refills | Status: DC

## 2019-10-25 NOTE — Progress Notes (Signed)
   Follow-Up Visit   Subjective  George Rodriguez is a 29 y.o. male who presents for the following: Atopic Derm f/u (Atopic Derm pt was on Dupixent, his last injection was 10/06/19, pt has been on Dupixent for around 3 yrs, olux foam prn, no need for asthma medication since starting dupixent). Dermatitis He complains of a rash. Symptoms began 3 years ago. Patient describes the rash as clear today. Characteristics of rash and associated history: none. His previous dermatologic history includes atopic dermatitis. Family history of derm problems: unknown. Medications currently using: Dupixent, Olux foam prn. Environmental exposures or allergies resolved since starting Dupixent.  No hx of s/e or eye issues with Dupixent     The following portions of the chart were reviewed this encounter and updated as appropriate:     Review of Systems: No other skin or systemic complaints.  Objective  Well appearing patient in no apparent distress; mood and affect are within normal limits.  A focused examination was performed including arms, back. Relevant physical exam findings are noted in the Assessment and Plan.  Objective  arms, lower back: Mild erythema with mild lichenification anticubitum, similar lower back   Assessment & Plan  Atopic dermatitis, unspecified type arms, lower back  Cont Olux foam qd aa arms, back prn flares, avoid face, groin, axilla  Sample of Dupixent 300mg /71ml pen x 1 given to pt Lot [id number] exp 06/08/21  Dupilumab (DUPIXENT) 300 MG/2ML SOPN - arms, lower back  Return in about 6 months (around 04/26/2020) for AD/Dupixent f/u.   I, 04/28/2020, RMA, am acting as scribe for Ardis Rowan, MD .

## 2019-11-15 ENCOUNTER — Other Ambulatory Visit: Payer: Self-pay

## 2019-11-15 MED ORDER — DUPIXENT 300 MG/2ML ~~LOC~~ SOSY: 300.0000 mg | PREFILLED_SYRINGE | SUBCUTANEOUS | 4 refills | Status: DC

## 2020-03-27 ENCOUNTER — Other Ambulatory Visit: Payer: Self-pay | Admitting: Dermatology

## 2020-04-29 ENCOUNTER — Ambulatory Visit: Payer: BC Managed Care – PPO | Admitting: Dermatology

## 2020-06-05 ENCOUNTER — Other Ambulatory Visit: Payer: Self-pay | Admitting: Family Medicine

## 2020-06-05 NOTE — Telephone Encounter (Signed)
Requested medication (s) are due for refill today: no  Requested medication (s) are on the active medication list: yes   Future visit scheduled: no  Notes to clinic:  script is expired Overdue for follow up    Requested Prescriptions  Pending Prescriptions Disp Refills   BREO ELLIPTA 100-25 MCG/INH AEPB [Pharmacy Med Name: BREO ELLIPTA 100-25 MCG INH] 60 each 5    Sig: INHALE 1 PUFF BY MOUTH EVERY DAY      Pulmonology:  Combination Products Failed - 06/05/2020 12:40 PM      Failed - Valid encounter within last 12 months    Recent Outpatient Visits           8 months ago Lower abdominal pain   [facility] Roosvelt Maser [place], [place]   1 year ago Moderate persistent asthma without complication   [facility] [place], [place], [place]   1 year ago Influenza A   [facility] Roosvelt Maser [place], [place]   2 years ago Upper respiratory tract infection, unspecified type   [facility] [place], [place], [place]   3 years ago Elevated blood pressure reading   [facility], [place], [place]

## 2020-06-05 NOTE — Telephone Encounter (Signed)
Copied from CRM [id number]. Topic: Quick Communication - Rx Refill/Question >> Jun 05, 2020 12:45 PM Mcneil, Ja-Kwan wrote: Medication: PROAIR HFA 108 (90 Base) MCG/ACT inhaler  Has the patient contacted their pharmacy? no  Preferred Pharmacy (with phone number or street name): [id number] - [place], [place] - [address]  Phone: [phone]  Fax: [phone]  Agent: Please be advised that RX refills may take up to 3 business days. We ask that you follow-up with your pharmacy.

## 2020-06-05 NOTE — Telephone Encounter (Signed)
Requested medication (s) are due for refill today: no  Requested medication (s) are on the active medication list: yes  Last refill:  01/23/2019  Future visit scheduled: no  Notes to clinic:  Attempted to reach patient with no success. Was unable to leave a vm    Requested Prescriptions  Pending Prescriptions Disp Refills   PROAIR HFA 108 (90 Base) MCG/ACT inhaler      Sig: Inhale 1-2 puffs into the lungs every 6 (six) hours as needed for wheezing or shortness of breath.      Pulmonology:  Beta Agonists Failed - 06/05/2020 12:48 PM      Failed - One inhaler should last at least one month. If the patient is requesting refills earlier, contact the patient to check for uncontrolled symptoms.      Failed - Valid encounter within last 12 months    Recent Outpatient Visits           8 months ago Lower abdominal pain   [facility] Roosvelt Maser [place], [place]   1 year ago Moderate persistent asthma without complication   [facility] [place], [place], [place]   1 year ago Influenza A   [facility] Roosvelt Maser [place], [place]   2 years ago Upper respiratory tract infection, unspecified type   [facility] Roosvelt Maser [place], [place]   3 years ago Elevated blood pressure reading   [facility], [place], [place]

## 2020-06-09 ENCOUNTER — Other Ambulatory Visit: Payer: Self-pay | Admitting: Family Medicine

## 2020-06-09 MED ORDER — PROAIR HFA 108 (90 BASE) MCG/ACT IN AERS: 1.0000 | INHALATION_SPRAY | Freq: Four times a day (QID) | RESPIRATORY_TRACT | 0 refills | Status: DC | PRN

## 2020-06-09 NOTE — Telephone Encounter (Signed)
Requested medication (s) are due for refill today: no  Requested medication (s) are on the active medication list: yes  Last refill:  today by dr Laural Benes  Future visit scheduled:No attempted to contact patient Last OV 09/14/19 was unable to leave VM Number not in service  Notes to clinic:  Pharmacy requesting alternative.    Requested Prescriptions  Pending Prescriptions Disp Refills   albuterol (VENTOLIN HFA) 108 (90 Base) MCG/ACT inhaler [Pharmacy Med Name: ALBUTEROL HFA 90 MCG INHALER]  0      Pulmonology:  Beta Agonists Failed - 06/09/2020 12:44 PM      Failed - One inhaler should last at least one month. If the patient is requesting refills earlier, contact the patient to check for uncontrolled symptoms.      Failed - Valid encounter within last 12 months    Recent Outpatient Visits           8 months ago Lower abdominal pain   [facility] Roosvelt Maser [place], [place]   1 year ago Moderate persistent asthma without complication   [facility] [place], Megan [place], DO   1 year ago Influenza A   [facility] Roosvelt Maser [place], [place]   2 years ago Upper respiratory tract infection, unspecified type   [facility] Roosvelt Maser [place], [place]   3 years ago Elevated blood pressure reading   [facility], [place], [place]

## 2020-06-10 NOTE — Telephone Encounter (Signed)
Routing to provider. Looks like pharmacy sent the requested medication and would just like it approved and sent in.

## 2020-06-26 ENCOUNTER — Other Ambulatory Visit: Payer: Self-pay | Admitting: Dermatology

## 2020-07-01 ENCOUNTER — Other Ambulatory Visit: Payer: Self-pay | Admitting: Family Medicine

## 2020-07-01 NOTE — Telephone Encounter (Signed)
Requested medication (s) are due for refill today:no  Requested medication (s) are on the active medication list:yes   Last refill:  07/01/2020  Future visit scheduled:no  Notes to clinic:  attempted to contact patient number disconnect Overdue for office visit    Requested Prescriptions  Pending Prescriptions Disp Refills   albuterol (VENTOLIN HFA) 108 (90 Base) MCG/ACT inhaler [Pharmacy Med Name: ALBUTEROL HFA (PROVENTIL) INH] 6.7 each     Sig: INHALE 1-2 PUFFS INTO THE LUNGS EVERY 4 HOURS AS NEEDED FOR WHEEZING OR SHORTNESS OF BREATH.      Pulmonology:  Beta Agonists Failed - 07/01/2020  1:19 AM      Failed - One inhaler should last at least one month. If the patient is requesting refills earlier, contact the patient to check for uncontrolled symptoms.      Failed - Valid encounter within last 12 months    Recent Outpatient Visits           9 months ago Lower abdominal pain   [facility] Roosvelt Maser [place], [place]   1 year ago Moderate persistent asthma without complication   [facility] [place], [place], DO   1 year ago Influenza A   [facility] Roosvelt Maser [place], [place]   2 years ago Upper respiratory tract infection, unspecified type   [facility] Roosvelt Maser [place], [place]   3 years ago Elevated blood pressure reading   [facility], [place], [place]

## 2020-07-06 ENCOUNTER — Other Ambulatory Visit: Payer: Self-pay | Admitting: Family Medicine

## 2020-07-06 NOTE — Telephone Encounter (Signed)
Requested Prescriptions  Pending Prescriptions Disp Refills   BREO ELLIPTA 100-25 MCG/INH AEPB [Pharmacy Med Name: BREO ELLIPTA 100-25 MCG INH] 60 each 0    Sig: INHALE 1 PUFF BY MOUTH EVERY DAY     Pulmonology:  Combination Products Failed - 07/06/2020  8:53 AM      Failed - Valid encounter within last 12 months    Recent Outpatient Visits          9 months ago Lower abdominal pain   [facility] Roosvelt Maser [place], [place]   1 year ago Moderate persistent asthma without complication   [facility] [place], Megan [place], DO   1 year ago Influenza A   [facility] Roosvelt Maser [place], [place]   2 years ago Upper respiratory tract infection, unspecified type   [facility], [place], [place]   3 years ago Elevated blood pressure reading   [facility], [place], [place]

## 2020-07-24 ENCOUNTER — Other Ambulatory Visit: Payer: Self-pay | Admitting: Dermatology

## 2020-07-30 ENCOUNTER — Other Ambulatory Visit: Payer: Self-pay | Admitting: Dermatology

## 2020-08-11 ENCOUNTER — Telehealth: Payer: Self-pay

## 2020-08-11 NOTE — Telephone Encounter (Signed)
I returned patient's call but he didn't answer and his voicemail box is full. Patient will need to schedule an appointment to be seen before we can refill his Dupxient.

## 2020-08-12 ENCOUNTER — Other Ambulatory Visit: Payer: Self-pay | Admitting: Dermatology

## 2020-08-14 ENCOUNTER — Other Ambulatory Visit: Payer: Self-pay | Admitting: Dermatology

## 2020-08-14 ENCOUNTER — Other Ambulatory Visit: Payer: Self-pay

## 2020-08-14 DIAGNOSIS — L209 Atopic dermatitis, unspecified: Secondary | ICD-10-CM

## 2020-08-14 MED ORDER — DUPIXENT 300 MG/2ML ~~LOC~~ SOAJ: 300.0000 mg | SUBCUTANEOUS | 0 refills | Status: DC

## 2020-08-14 NOTE — Progress Notes (Signed)
Dupixent RF. Patient has reschedule for 1 month.

## 2020-08-19 ENCOUNTER — Other Ambulatory Visit: Payer: Self-pay

## 2020-08-19 DIAGNOSIS — L209 Atopic dermatitis, unspecified: Secondary | ICD-10-CM

## 2020-08-19 MED ORDER — DUPIXENT 300 MG/2ML ~~LOC~~ SOSY: 300.0000 mg | PREFILLED_SYRINGE | SUBCUTANEOUS | 0 refills | Status: DC

## 2020-08-19 NOTE — Progress Notes (Signed)
Pharmacy requested syringes instead of pens.

## 2020-09-03 ENCOUNTER — Other Ambulatory Visit: Payer: Self-pay | Admitting: Dermatology

## 2020-09-03 DIAGNOSIS — L209 Atopic dermatitis, unspecified: Secondary | ICD-10-CM

## 2020-09-16 ENCOUNTER — Ambulatory Visit: Payer: BC Managed Care – PPO | Admitting: Dermatology

## 2020-09-16 ENCOUNTER — Other Ambulatory Visit: Payer: Self-pay

## 2020-09-16 DIAGNOSIS — B352 Tinea manuum: Secondary | ICD-10-CM | POA: Diagnosis not present

## 2020-09-16 DIAGNOSIS — B351 Tinea unguium: Secondary | ICD-10-CM | POA: Diagnosis not present

## 2020-09-16 DIAGNOSIS — L209 Atopic dermatitis, unspecified: Secondary | ICD-10-CM

## 2020-09-16 MED ORDER — BRYHALI 0.01 % EX LOTN: 1.0000 "application " | TOPICAL_LOTION | Freq: Two times a day (BID) | CUTANEOUS | 1 refills | Status: DC

## 2020-09-16 MED ORDER — OPZELURA 1.5 % EX CREA: 1.0000 "application " | TOPICAL_CREAM | Freq: Two times a day (BID) | CUTANEOUS | 1 refills | Status: DC

## 2020-09-16 MED ORDER — TERBINAFINE HCL 250 MG PO TABS: 250.0000 mg | ORAL_TABLET | Freq: Every day | ORAL | 1 refills | Status: DC

## 2020-09-16 MED ORDER — DUPIXENT 300 MG/2ML ~~LOC~~ SOSY: 300.0000 mg | PREFILLED_SYRINGE | SUBCUTANEOUS | 5 refills | Status: DC

## 2020-09-16 NOTE — Patient Instructions (Signed)
Dupilumab (Dupixent) is a treatment given by injection for adults with moderate-to-severe atopic dermatitis. Goal is control of skin condition, not cure. It is given as 2 injections at the first dose followed by 1 injection ever 2 weeks thereafter.  Potential side effects include allergic reaction, herpes infections, injection site reactions and conjunctivitis (inflammation of the eyes).  The use of Dupixent requires long term medication management, including periodic office visits.  

## 2020-09-16 NOTE — Progress Notes (Signed)
Follow-Up Visit   Subjective  George Rodriguez is a 30 y.o. male who presents for the following: Follow-up (Patient here today for follow up on atopic dermatitis. Patient was using dupilumab or dupixent and was unable to get refill due to missing last appointment. ).  No side effects.  Seems to be helping his asthma- using his inhalers less.  He has flared off of medication.  He currently has Olux foam which burns when he applies it. Patient tried and failed multiple topicals- Triamcinolone cream, clobetasol ointment, elidil cream, and tacrolimus. He also has a problem with the nails on his R hand. No h/o liver disease.     The following portions of the chart were reviewed this encounter and updated as appropriate:       Objective  Well appearing patient in no apparent distress; mood and affect are within normal limits.  A focused examination was performed including face, arms, back. Relevant physical exam findings are noted in the Assessment and Plan.  Objective  bilateral arm, lower back, face: Pink scaly patches on forehead, periorbital area, antecubital area BL with lichenification   Objective  Right Hand - Anterior: Nail dystrophy all 5 nails R hand  Erythema and scale on R palm   L hand and nails are clear  Images    Assessment & Plan  Atopic dermatitis, unspecified type bilateral arm, lower back, face  Severe, with flare off of Dupixent  Atopic dermatitis - Severe, on Dupixent (biologic medication).  Atopic dermatitis (eczema) is a chronic, relapsing, pruritic condition that can significantly affect quality of life. It is often associated with allergic rhinitis and/or asthma and can require treatment with topical medications, phototherapy, or in severe cases a biologic medication called Dupixent.    Start Opzelura 1.5 % cream apply 1 application twice daily to affected areas of face and body.    Start Bryhali 0.01 % lotion apply topically twice daily to affected  areas of body. Avoid applying to face, groin, and axilla. Use as directed. Risk of skin atrophy with long-term use reviewed.  DC olux foam  Restart Dupilumab (DUPIXENT) 300 MG/2ML SOPN - arms, lower back  Dupilumab (Dupixent) is a treatment given by injection for adults with moderate-to-severe atopic dermatitis. Goal is control of skin condition, not cure. It is given as 2 injections at the first dose followed by 1 injection ever 2 weeks thereafter.  Potential side effects include allergic reaction, herpes infections, injection site reactions and conjunctivitis (inflammation of the eyes).  The use of Dupixent requires long term medication management, including periodic office visits.   Ruxolitinib Phosphate (OPZELURA) 1.5 % CREA - bilateral arm, lower back, face  Halobetasol Propionate (BRYHALI) 0.01 % LOTN - bilateral arm, lower back, face  dupilumab (DUPIXENT) 300 MG/2ML prefilled syringe - bilateral arm, lower back, face  Other Related Medications Dupilumab (DUPIXENT) 300 MG/2ML SOPN  Onychomycosis Right Hand - Anterior  With associated Tinea Manis   Lamisil 250 mg take 1 pill daily with food, 1 refill   Terbinafine Counseling  Terbinafine is an anti-fungal medicine that can be applied to the skin (over the counter) or taken by mouth (prescription) to treat fungal infections. The pill version is often used to treat fungal infections of the nails or scalp. While most people do not have any side effects from taking terbinafine pills, some possible side effects of the medicine can include taste changes, headache, loss of smell, vision changes, nausea, vomiting, or diarrhea.   Rare side effects  can include irritation of the liver, allergic reaction, or decrease in blood counts (which may show up as not feeling well or developing an infection). If you are concerned about any of these side effects, please stop the medicine and call your doctor, or in the case of an emergency such as feeling  very unwell, seek immediate medical care.          Ordered Medications: terbinafine (LAMISIL) 250 MG tablet  Return in about 2 months (around 11/14/2020) for 2 month follow up on atopic derm and nails .  I, Asher Muir, CMA, am acting as scribe for Willeen Niece, MD.  Documentation: I have reviewed the above documentation for accuracy and completeness, and I agree with the above.  Willeen Niece MD

## 2020-11-11 ENCOUNTER — Ambulatory Visit: Payer: BC Managed Care – PPO | Admitting: Dermatology

## 2021-03-18 ENCOUNTER — Telehealth: Payer: BC Managed Care – PPO | Admitting: Nurse Practitioner

## 2021-03-18 NOTE — Progress Notes (Deleted)
   There were no vitals taken for this visit.   Subjective:    Patient ID: George Rodriguez, male    DOB: May 31, 1991, 30 y.o.   MRN: [id number]  HPI: George Rodriguez is a 30 y.o. male  No chief complaint on file.  EAR PROBLEM   Relevant past medical, surgical, family and social history reviewed and updated as indicated. Interim medical history since our last visit reviewed. Allergies and medications reviewed and updated.  Review of Systems  Per HPI unless specifically indicated above     Objective:    There were no vitals taken for this visit.  Wt Readings from Last 3 Encounters:  09/14/19 230 lb (104.3 kg)  09/08/18 220 lb 12.8 oz (100.2 kg)  07/13/17 218 lb 6.4 oz (99.1 kg)    Physical Exam  Results for orders placed or performed in visit on 09/08/18  Veritor Flu A/B Waived  Result Value Ref Range   Influenza A Positive (A) Negative   Influenza B Negative Negative      Assessment & Plan:   Problem List Items Addressed This Visit   None    Follow up plan: No follow-ups on file.

## 2021-04-09 ENCOUNTER — Other Ambulatory Visit: Payer: Self-pay | Admitting: Dermatology

## 2021-04-09 DIAGNOSIS — L209 Atopic dermatitis, unspecified: Secondary | ICD-10-CM

## 2021-06-10 ENCOUNTER — Other Ambulatory Visit: Payer: Self-pay | Admitting: Dermatology

## 2021-06-10 DIAGNOSIS — L209 Atopic dermatitis, unspecified: Secondary | ICD-10-CM

## 2021-07-13 ENCOUNTER — Other Ambulatory Visit: Payer: Self-pay | Admitting: Dermatology

## 2021-07-13 DIAGNOSIS — L209 Atopic dermatitis, unspecified: Secondary | ICD-10-CM

## 2021-07-19 NOTE — Progress Notes (Signed)
BP 118/78    Subjective:    Patient ID: George Rodriguez, male    DOB: 28-Jan-1991, 30 y.o.   MRN: [id number]  HPI: George D Boylan is a 30 y.o. male  Chief Complaint  Patient presents with   Covid Positive    Pt states he tested positive for covid last Monday, 07/13/21. States he needs a work note for last week, Monday through Friday.    COVID POSITIVE Patient tested positive for COVID last Monday 07/13/2021.  He needs a letter for work for 07/13/2021-07/17/2021.  He tested negative twice on Saturday and once on Sunday. Denies fever, sob, or cough.   Denies HA, CP, SOB, dizziness, palpitations, visual changes, and lower extremity swelling.  Relevant past medical, surgical, family and social history reviewed and updated as indicated. Interim medical history since our last visit reviewed. Allergies and medications reviewed and updated.  Review of Systems  Constitutional:  Negative for fever.  Eyes:  Negative for visual disturbance.  Respiratory:  Negative for cough and shortness of breath.   Cardiovascular:  Negative for chest pain and leg swelling.  Neurological:  Negative for light-headedness and headaches.   Per HPI unless specifically indicated above     Objective:    BP 118/78   Wt Readings from Last 3 Encounters:  09/14/19 230 lb (104.3 kg)  09/08/18 220 lb 12.8 oz (100.2 kg)  07/13/17 218 lb 6.4 oz (99.1 kg)    Physical Exam Vitals and nursing note reviewed.  Constitutional:      General: He is not in acute distress.    Appearance: He is not ill-appearing.  HENT:     Head: Normocephalic.     Right Ear: Hearing normal.     Left Ear: Hearing normal.     Nose: Nose normal.  Pulmonary:     Effort: Pulmonary effort is normal. No respiratory distress.  Neurological:     Mental Status: He is alert.  Psychiatric:        Mood and Affect: Mood normal.        Behavior: Behavior normal.        Thought Content: Thought content normal.        Judgment: Judgment normal.     Results for orders placed or performed in visit on 09/08/18  Veritor Flu A/B Waived  Result Value Ref Range   Influenza A Positive (A) Negative   Influenza B Negative Negative      Assessment & Plan:   Problem List Items Addressed This Visit   None Visit Diagnoses     COVID-19    -  Primary   Symptoms have resolved. Needs letter for work. Letter written for patient. He completed quarantine on Friday. Can return to work today.         Follow up plan: No follow-ups on file.   This visit was completed via MyChart due to the restrictions of the COVID-19 pandemic. All issues as above were discussed and addressed. Physical exam was done as above through visual confirmation on MyChart. If it was felt that the patient should be evaluated in the office, they were directed there. The patient verbally consented to this visit. Location of the patient: Work Location of the provider: Office Those involved with this call:  Provider: Larae Grooms, NP CMA: Wilhemena Durie, CMA Front Desk/Registration: Channing Mutters This encounter was conducted via video.  I spent 15 dedicated to the care of this patient on the date of this encounter to include  previsit review of 20, face to face time with the patient, and post visit ordering of testing.

## 2021-07-20 ENCOUNTER — Telehealth (INDEPENDENT_AMBULATORY_CARE_PROVIDER_SITE_OTHER): Payer: Self-pay | Admitting: Nurse Practitioner

## 2021-07-20 ENCOUNTER — Encounter: Payer: Self-pay | Admitting: Nurse Practitioner

## 2021-07-20 VITALS — BP 118/78

## 2021-07-20 DIAGNOSIS — U071 COVID-19: Secondary | ICD-10-CM

## 2021-08-26 ENCOUNTER — Other Ambulatory Visit: Payer: Self-pay | Admitting: Dermatology

## 2021-08-26 DIAGNOSIS — L209 Atopic dermatitis, unspecified: Secondary | ICD-10-CM

## 2021-09-15 ENCOUNTER — Other Ambulatory Visit: Payer: Self-pay | Admitting: Dermatology

## 2021-09-15 DIAGNOSIS — L209 Atopic dermatitis, unspecified: Secondary | ICD-10-CM

## 2021-09-29 ENCOUNTER — Other Ambulatory Visit: Payer: Self-pay | Admitting: Dermatology

## 2021-09-29 DIAGNOSIS — L209 Atopic dermatitis, unspecified: Secondary | ICD-10-CM

## 2021-10-07 ENCOUNTER — Ambulatory Visit: Payer: BC Managed Care – PPO | Admitting: Dermatology

## 2021-10-21 ENCOUNTER — Encounter: Payer: Self-pay | Admitting: Dermatology

## 2021-10-21 ENCOUNTER — Ambulatory Visit: Payer: BC Managed Care – PPO | Admitting: Dermatology

## 2021-10-21 ENCOUNTER — Other Ambulatory Visit: Payer: Self-pay

## 2021-10-21 DIAGNOSIS — L2089 Other atopic dermatitis: Secondary | ICD-10-CM | POA: Diagnosis not present

## 2021-10-21 DIAGNOSIS — L209 Atopic dermatitis, unspecified: Secondary | ICD-10-CM

## 2021-10-21 DIAGNOSIS — Z79899 Other long term (current) drug therapy: Secondary | ICD-10-CM | POA: Diagnosis not present

## 2021-10-21 MED ORDER — CLOBETASOL PROPIONATE EMULSION 0.05 % EX FOAM: CUTANEOUS | 2 refills | Status: DC

## 2021-10-21 MED ORDER — OPZELURA 1.5 % EX CREA: 1.0000 "application " | TOPICAL_CREAM | Freq: Two times a day (BID) | CUTANEOUS | 3 refills | Status: DC

## 2021-10-21 MED ORDER — DUPIXENT 300 MG/2ML ~~LOC~~ SOSY: 300.0000 mg | PREFILLED_SYRINGE | SUBCUTANEOUS | 5 refills | Status: DC

## 2021-10-21 NOTE — Progress Notes (Signed)
? ?Follow-Up Visit ?  ?Subjective  ?George Rodriguez is a 31 y.o. male who presents for the following: Eczema (Here for annual recheck. On Dupixent. Needs refills. Dupixent controls well. Not currently using topicals).  He has used Opzelura cream and Olux E foam which works best for him.  Missed injections last month because ran out of rfs.  No side effects, no eye irritation. ? ? ? ?The following portions of the chart were reviewed this encounter and updated as appropriate:   ?  ? ?Review of Systems: No other skin or systemic complaints except as noted in HPI or Assessment and Plan. ? ? ?Objective  ?Well appearing patient in no apparent distress; mood and affect are within normal limits. ? ?A focused examination was performed including face, arms, torso. Relevant physical exam findings are noted in the Assessment and Plan. ? ?Right Forearm - Anterior ?Pink lichenified patches B/L antecubitum, erythema and scale infraocular. Hyperpigmented patch with lichenification at right lower back. ? ? ?Assessment & Plan  ?Other atopic dermatitis ?Right Forearm - Anterior ? ?Atopic dermatitis - Severe, on Dupixent (biologic medication). Well-controlled on Dupixent. Mild flare today since off medication due to running out of rfs. ?  ?Atopic dermatitis (eczema) is a chronic, relapsing, pruritic condition that can significantly affect quality of life. It is often associated with allergic rhinitis and/or asthma and can require treatment with topical medications, phototherapy, or in severe cases a biologic medication called Dupixent, which requires long term medication management.   ? ?restart Dupixent 300mg  injection every other week. ? ?restart Opzelura 1.5 % cream apply 1 application 1-2 times daily to affected areas of face and body.  ? ?restart Olux E Foam twice daily to affected body areas up to 2 weeks as needed. Avoid applying to face, groin, and axilla. Use as directed. Long-term use can cause thinning of the skin.   ? ?Topical steroids (such as triamcinolone, fluocinolone, fluocinonide, mometasone, clobetasol, halobetasol, betamethasone, hydrocortisone) can cause thinning and lightening of the skin if they are used for too long in the same area. Your physician has selected the right strength medicine for your problem and area affected on the body. Please use your medication only as directed by your physician to prevent side effects.   ? ?Dupilumab (Dupixent) is a treatment given by injection for adults and children with moderate-to-severe atopic dermatitis. Goal is control of skin condition, not cure. It is given as 2 injections at the first dose followed by 1 injection ever 2 weeks thereafter.  Young children are dosed monthly. ? ?Potential side effects include allergic reaction, herpes infections, injection site reactions and conjunctivitis (inflammation of the eyes).  The use of Dupixent requires long term medication management, including periodic office visits.  ? ? ? ?Clobetasol Propionate Emulsion 0.05 % topical foam - Right Forearm - Anterior ?Apply BID to affected areas on body up to 2 weeks as needed. Avoid applying to face, groin, and axilla. ? ?Atopic dermatitis, unspecified type ? ?Related Medications ?Halobetasol Propionate (BRYHALI) 0.01 % LOTN ?Apply 1 application topically in the morning and at bedtime. Apply to affect areas of body Avoid applying to face, groin, and axilla. Use as directed. Risk of skin atrophy with long-term use reviewed. ? ?Ruxolitinib Phosphate (OPZELURA) 1.5 % CREA ?Apply 1 application. topically in the morning and at bedtime. Apply to affected areas of face and body ? ?dupilumab (DUPIXENT) 300 MG/2ML prefilled syringe ?Inject 300 mg into the skin every 14 (fourteen) days. ? ? ?Return in about  6 months (around 04/23/2022) for Atopic Dermatitis. ? ?I, Lawson Radar, CMA, am acting as scribe for Willeen Niece, MD. ? ?Documentation: I have reviewed the above documentation for accuracy and  completeness, and I agree with the above. ? ?Willeen Niece MD  ? ? ?

## 2021-10-21 NOTE — Patient Instructions (Addendum)
Continue Dupixent 300mg  injection every other week. ? ?Continue Opzelura 1.5 % cream apply 1 application 1-2 times daily to affected areas of face and body.  ? ?Continue Olux E Foam twice daily to affected body areas up to 2 weeks as needed. Avoid applying to face, groin, and axilla. Use as directed. Long-term use can cause thinning of the skin.  ? ?Dupilumab (Dupixent) is a treatment given by injection for adults and children with moderate-to-severe atopic dermatitis. Goal is control of skin condition, not cure. It is given as 2 injections at the first dose followed by 1 injection ever 2 weeks thereafter.  Young children are dosed monthly. ? ?Potential side effects include allergic reaction, herpes infections, injection site reactions and conjunctivitis (inflammation of the eyes).  The use of Dupixent requires long term medication management, including periodic office visits.  ? ?Topical steroids (such as triamcinolone, fluocinolone, fluocinonide, mometasone, clobetasol, halobetasol, betamethasone, hydrocortisone) can cause thinning and lightening of the skin if they are used for too long in the same area. Your physician has selected the right strength medicine for your problem and area affected on the body. Please use your medication only as directed by your physician to prevent side effects.   ? ? ? ?If You Need Anything After Your Visit ? ?If you have any questions or concerns for your doctor, please call our main line at [phone] and press option 4 to reach your doctor's medical assistant. If no one answers, please leave a voicemail as directed and we will return your call as soon as possible. Messages left after 4 pm will be answered the following business day.  ? ?You may also send [phone] a message via MyChart. We typically respond to MyChart messages within 1-2 business days. ? ?For prescription refills, please ask your pharmacy to contact our office. Our fax number is [phone]. ? ?If you have an urgent  issue when the clinic is closed that cannot wait until the next business day, you can page your doctor at the number below.   ? ?Please note that while we do our best to be available for urgent issues outside of office hours, we are not available 24/7.  ? ?If you have an urgent issue and are unable to reach [phone], you may choose to seek medical care at your doctor's office, retail clinic, urgent care center, or emergency room. ? ?If you have a medical emergency, please immediately call 911 or go to the emergency department. ? ?Pager Numbers ? ?- Dr. [place]: [phone] ? ?- Dr. [phone]: [phone] ? ?- Dr. [phone]: [phone] ? ?In the event of inclement weather, please call our main line at [phone] for an update on the status of any delays or closures. ? ?Dermatology Medication Tips: ?Please keep the boxes that topical medications come in in order to help keep track of the instructions about where and how to use these. Pharmacies typically print the medication instructions only on the boxes and not directly on the medication tubes.  ? ?If your medication is too expensive, please contact our office at [phone] option 4 or send [phone] a message through MyChart.  ? ?We are unable to tell what your co-pay for medications will be in advance as this is different depending on your insurance coverage. However, we may be able to find a substitute medication at lower cost or fill out paperwork to get insurance to cover a needed medication.  ? ?If a prior authorization is required to get your medication covered by  your insurance company, please allow us 1-2 business days to complete this process. ? ?Drug prices often vary depending on where the prescription is filled and some pharmacies may offer cheaper prices. ? ?The website www.goodrx.com contains coupons for medications through different pharmacies. The prices here do not account for what the cost may be with help from insurance (it may be cheaper with your  insurance), but the website can give you the price if you did not use any insurance.  ?- You can print the associated coupon and take it with your prescription to the pharmacy.  ?- You may also stop by our office during regular business hours and pick up a GoodRx coupon card.  ?- If you need your prescription sent electronically to a different pharmacy, notify our office through [facility] or by phone at [phone] option 4. ? ? ? ? ?Si Usted Necesita Algo Despu?s de Su Visita ? ?Tambi?n puede enviarnos un mensaje a trav?s de MyChart. Por lo general respondemos a los mensajes de MyChart en el transcurso de 1 a 2 d?as h?biles. ? ?Para renovar recetas, por favor pida a su farmacia que se ponga en contacto con nuestra oficina. Nuestro n?mero de fax es el [phone]. ? ?Si tiene un asunto urgente cuando la cl?nica est? cerrada y que no puede esperar hasta el siguiente d?a h?bil, puede llamar/localizar a su doctor(a) al n?mero que aparece a continuaci?n.  ? ?Por favor, tenga en cuenta que aunque hacemos todo lo posible para estar disponibles para asuntos urgentes fuera del horario de oficina, no estamos disponibles las 24 horas del d?a, los 7 d?as de la semana.  ? ?Si tiene un problema urgente y no puede comunicarse con nosotros, puede optar por buscar atenci?n m?dica  en el consultorio de su doctor(a), en una cl?nica privada, en un centro de atenci?n urgente o en una sala de emergencias. ? ?Si tiene Radio broadcast assistantuna emergencia m?dica, por favor llame inmediatamente al 911 o vaya a la sala de emergencias. ? ?N?meros de b?per ? ?- Dr. Gwen PoundsKowalski: [phone] ? ?- Dra. Moye: [phone] ? ?- Dra. Roseanne RenoStewart:: [phone] ? ?En caso de inclemencias del tiempo, por favor llame a nuestra l?nea principal al [phone] para una actualizaci?n sobre el estado de cualquier retraso o cierre. ? ?Consejos para la medicaci?n en dermatolog?a: ?Por favor, guarde las cajas en las que vienen los medicamentos de uso t?pico para ayudarle a  seguir las instrucciones sobre d?nde y c?mo usarlos. Las farmacias generalmente imprimen las instrucciones del medicamento s?lo en las cajas y no directamente en los tubos del [place].  ? ?Si su medicamento es muy caro, por favor, p?ngase en contacto con Rolm Galanuestra oficina llamando al [phone] y presione la opci?n 4 o env?enos un mensaje a trav?s de MyChart.  ? ?No podemos decirle cu?l ser? su copago por los medicamentos por adelantado ya que esto es diferente dependiendo de la cobertura de su seguro. Sin embargo, es posible que podamos encontrar un medicamento sustituto a Audiological scientistmenor costo o llenar un formulario para que el seguro cubra el medicamento que se considera necesario.  ? ?Si se requiere Neomia Dearuna autorizaci?n previa para que su compa??a de seguros [place] su medicamento, por favor perm?tanos de 1 a 2 d?as h?biles para completar este proceso. ? ?Los precios de los medicamentos var?an con frecuencia dependiendo del Environmental consultantlugar de d?nde se surte la receta y alguna farmacias pueden ofrecer precios m?s baratos. ? ?El sitio web www.goodrx.com tiene cupones para medicamentos de Health and safety inspectordiferentes farmacias. Los precios aqu? no tienen  en cuenta lo que podr?a costar con la ayuda del seguro (puede ser m?s barato con su seguro), pero el sitio web puede darle el precio si no utiliz? ning?n seguro.  ?- Puede imprimir el cup?n correspondiente y llevarlo con su receta a la farmacia.  ?- Tambi?n puede pasar por nuestra oficina durante el horario de atenci?n regular y recoger una tarjeta de cupones de GoodRx.  ?- Si necesita que su receta se env?e electr?nicamente a Psychiatrist, informe a nuestra oficina a trav?s de MyChart de [place] o por tel?fono llamando al [phone] y presione la opci?n 4.  ?

## 2022-04-26 ENCOUNTER — Ambulatory Visit: Payer: BC Managed Care – PPO | Admitting: Dermatology

## 2022-04-26 DIAGNOSIS — L209 Atopic dermatitis, unspecified: Secondary | ICD-10-CM

## 2022-04-26 MED ORDER — DUPIXENT 300 MG/2ML ~~LOC~~ SOSY: 300.0000 mg | PREFILLED_SYRINGE | SUBCUTANEOUS | 5 refills | Status: DC

## 2022-04-26 MED ORDER — CLOBETASOL PROPIONATE 0.05 % EX FOAM: CUTANEOUS | 2 refills | Status: DC

## 2022-04-26 MED ORDER — OPZELURA 1.5 % EX CREA: 1.0000 "application " | TOPICAL_CREAM | Freq: Two times a day (BID) | CUTANEOUS | 3 refills | Status: DC

## 2022-04-26 NOTE — Progress Notes (Signed)
Follow-Up Visit   Subjective  George Rodriguez is a 31 y.o. male who presents for the following: Eczema (6 month follow-up, Wrists, arms, back. Patient is flared now due to being out of Dupixent for about a month. He also has Clobetasol foam and Opzelura cream. He flares off and on, worse in the summer. He works outdoors and heat/sweat worsens. No injection site reactions. No eye irritation. Patient has appt with eye doctor next month. ). When he takes the Dupixent regularly, his eczema is well-controlled.  He was off med for about 2 mos in the past and flared as well.  The following portions of the chart were reviewed this encounter and updated as appropriate:       Review of Systems:  No other skin or systemic complaints except as noted in HPI or Assessment and Plan.  Objective  Well appearing patient in no apparent distress; mood and affect are within normal limits.  A focused examination was performed including face, arms. Relevant physical exam findings are noted in the Assessment and Plan.  arms, face, back Pink lichenified scaly plaque with excoriations R forearm, wrist, bilateral antecubitum, right lower back; erythema and scale of the eyebrows, forehead.    Assessment & Plan  Atopic dermatitis, unspecified type arms, face, back  Chronic and persistent condition with duration or expected duration over one year. Condition is bothersome/symptomatic for patient. Currently flared- recent flare off of Dupixent  Atopic dermatitis - Severe, on Dupixent (biologic medication).  Atopic dermatitis (eczema) is a chronic, relapsing, pruritic condition that can significantly affect quality of life. It is often associated with allergic rhinitis and/or asthma and can require treatment with topical medications, phototherapy, or in severe cases a biologic medication called Dupixent, which requires long term medication management.   Continue Dupixent 300 MG/2ML q 2 weeks 5Rf. Sample given to  patient today to take home for self-injection. Lot No. [id number] Exp 05/2024.  Dupilumab (Dupixent) is a treatment given by injection for adults and children with moderate-to-severe atopic dermatitis. Goal is control of skin condition, not cure. It is given as 2 injections at the first dose followed by 1 injection ever 2 weeks thereafter.  Young children are dosed monthly.  Potential side effects include allergic reaction, herpes infections, injection site reactions and conjunctivitis (inflammation of the eyes).  The use of Dupixent requires long term medication management, including periodic office visits.  Continue Clobetasol Foam BID for up to 2 weeks, then prn more severe flares 2Rf. Avoid face, groin, axilla.  Topical steroids (such as triamcinolone, fluocinolone, fluocinonide, mometasone, clobetasol, halobetasol, betamethasone, hydrocortisone) can cause thinning and lightening of the skin if they are used for too long in the same area. Your physician has selected the right strength medicine for your problem and area affected on the body. Please use your medication only as directed by your physician to prevent side effects.   Continue Opzelura Cream Apply to AA face/body QD/BID 3Rf.  Recommend mild soap and moisturizing cream 1-2 times daily.  Gentle skin care handout provided.    clobetasol (OLUX) 0.05 % topical foam - arms, face, back Apply to more severe areas eczema once to twice a day until improved. Avoid face, groin, underarms.  Related Medications dupilumab (DUPIXENT) 300 MG/2ML prefilled syringe Inject 300 mg into the skin every 14 (fourteen) days.  Ruxolitinib Phosphate (OPZELURA) 1.5 % CREA Apply 1 application  topically in the morning and at bedtime. Apply to affected areas of face and body.   Return  in about 6 months (around 10/25/2022) for Atopic Dermatitis.  IJamesetta Orleans, CMA, am acting as scribe for Brendolyn Patty, MD .  Documentation: I have reviewed the above  documentation for accuracy and completeness, and I agree with the above.  Brendolyn Patty MD

## 2022-04-26 NOTE — Patient Instructions (Addendum)
Continue Opzelura Cream - Apply to affected areas face/body twice a day. Continue Clobetasol - Apply once to twice a day to more severe areas. Avoid face, groin, underarms.  Topical steroids (such as triamcinolone, fluocinolone, fluocinonide, mometasone, clobetasol, halobetasol, betamethasone, hydrocortisone) can cause thinning and lightening of the skin if they are used for too long in the same area. Your physician has selected the right strength medicine for your problem and area affected on the body. Please use your medication only as directed by your physician to prevent side effects.   Dupilumab (Dupixent) is a treatment given by injection for adults and children with moderate-to-severe atopic dermatitis. Goal is control of skin condition, not cure. It is given as 2 injections at the first dose followed by 1 injection ever 2 weeks thereafter.  Young children are dosed monthly.  Potential side effects include allergic reaction, herpes infections, injection site reactions and conjunctivitis (inflammation of the eyes).  The use of Dupixent requires long term medication management, including periodic office visits.  Due to recent changes in healthcare laws, you may see results of your pathology and/or laboratory studies on MyChart before the doctors have had a chance to review them. We understand that in some cases there may be results that are confusing or concerning to you. Please understand that not all results are received at the same time and often the doctors may need to interpret multiple results in order to provide you with the best plan of care or course of treatment. Therefore, we ask that you please give [place] 2 business days to thoroughly review all your results before contacting the office for clarification. Should we see a critical lab result, you will be contacted sooner.   If You Need Anything After Your Visit  If you have any questions or concerns for your doctor, please call our main  line at [phone] and press option 4 to reach your doctor's medical assistant. If no one answers, please leave a voicemail as directed and we will return your call as soon as possible. Messages left after 4 pm will be answered the following business day.   You may also send [place] a message via [place]. We typically respond to MyChart messages within 1-2 business days.  For prescription refills, please ask your pharmacy to contact our office. Our fax number is [phone].  If you have an urgent issue when the clinic is closed that cannot wait until the next business day, you can page your doctor at the number below.    Please note that while we do our best to be available for urgent issues outside of office hours, we are not available 24/7.   If you have an urgent issue and are unable to reach [place], you may choose to seek medical care at your doctor's office, retail clinic, urgent care center, or emergency room.  If you have a medical emergency, please immediately call 911 or go to the emergency department.  Pager Numbers  - Dr. Nehemiah Massed: [phone]  - Dr. Laurence Ferrari: [phone]  - Dr. Nicole Kindred: [phone]  In the event of inclement weather, please call our main line at [phone] for an update on the status of any delays or closures.  Dermatology Medication Tips: Please keep the boxes that topical medications come in in order to help keep track of the instructions about where and how to use these. Pharmacies typically print the medication instructions only on the boxes and not directly on the medication tubes.   If your medication  is too expensive, please contact our office at [phone] option 4 or send [place] a message through MyChart.   We are unable to tell what your co-pay for medications will be in advance as this is different depending on your insurance coverage. However, we may be able to find a substitute medication at lower cost or fill out paperwork to get insurance to cover a  needed medication.   If a prior authorization is required to get your medication covered by your insurance company, please allow [place] 1-2 business days to complete this process.  Drug prices often vary depending on where the prescription is filled and some pharmacies may offer cheaper prices.  The website www.goodrx.com contains coupons for medications through different pharmacies. The prices here do not account for what the cost may be with help from insurance (it may be cheaper with your insurance), but the website can give you the price if you did not use any insurance.  - You can print the associated coupon and take it with your prescription to the pharmacy.  - You may also stop by our office during regular business hours and pick up a GoodRx coupon card.  - If you need your prescription sent electronically to a different pharmacy, notify our office through [facility] or by phone at [phone] option 4.     Si Usted Necesita Algo Despus de Su Visita  Tambin puede enviarnos un mensaje a travs de Clinical cytogeneticist. Por lo general respondemos a los mensajes de MyChart en el transcurso de 1 a 2 das hbiles.  Para renovar recetas, por favor pida a su farmacia que se ponga en contacto con nuestra oficina. [name] de fax es [place] [phone].  Si tiene un asunto urgente cuando la clnica est cerrada y que no puede esperar hasta el siguiente da hbil, puede llamar/localizar a su doctor(a) al nmero que aparece a continuacin.   Por favor, tenga en cuenta que aunque hacemos todo lo posible para estar disponibles para asuntos urgentes fuera del horario de [place], no estamos disponibles las 24 horas del da, los 7 [address]  [address] de la [place].   Si tiene un problema urgente y no puede comunicarse con nosotros, puede optar por buscar atencin mdica  en el consultorio de su doctor(a), en una clnica privada, en un centro de atencin urgente o en una sala de emergencias.  Si tiene Psychologist, clinical, por favor llame inmediatamente al 911 o vaya a la sala de emergencias.  Nmeros de bper  - Dr. Gwen Pounds: [phone]  - Dra. Moye: [phone]  - Dra. Roseanne Reno: [phone]  En caso de inclemencias del [place], por favor llame a Lacy Duverney principal al [phone] para una actualizacin sobre el [place] de cualquier retraso o cierre.  Consejos para la medicacin en dermatologa: Por favor, guarde las cajas en las que vienen los medicamentos de uso tpico para ayudarle a seguir las instrucciones sobre dnde y cmo usarlos. Las farmacias generalmente imprimen las instrucciones del medicamento slo en las cajas y no directamente en los tubos del [place].   Si su medicamento es muy caro, por favor, pngase en contacto con Rolm Gala llamando al [phone] y presione la opcin 4 o envenos un mensaje a travs de Clinical cytogeneticist.   No podemos decirle cul ser su copago por los medicamentos por adelantado ya que esto es diferente dependiendo de la cobertura de su seguro. Sin embargo, es posible que podamos encontrar un medicamento sustituto a Audiological scientist un formulario para que  el seguro cubra el medicamento que se considera necesario.   Si se requiere una autorizacin previa para que su compaa de seguros [place] su medicamento, por favor permtanos de 1 a 2 das hbiles para completar [address]  Los precios de los medicamentos varan con frecuencia dependiendo del Environmental consultant de dnde se surte la receta y alguna farmacias pueden ofrecer precios ms baratos.  El sitio web www.goodrx.com tiene cupones para medicamentos de Health and safety inspector. Los precios aqu no tienen en cuenta lo que podra costar con la ayuda del seguro (puede ser ms barato con su seguro), pero el sitio web puede darle el precio si no utiliz Tourist information centre manager.  - Puede imprimir el cupn correspondiente y llevarlo con su receta a la farmacia.  - Tambin puede pasar por nuestra oficina durante el horario de  atencin regular y Education officer, museum una tarjeta de cupones de GoodRx.  - Si necesita que su receta se enve electrnicamente a una farmacia diferente, informe a nuestra oficina a travs de MyChart de [place] o por telfono llamando al [phone] y presione la opcin 4.

## 2022-08-16 ENCOUNTER — Telehealth: Payer: BC Managed Care – PPO | Admitting: Nurse Practitioner

## 2022-08-16 DIAGNOSIS — U071 COVID-19: Secondary | ICD-10-CM

## 2022-08-16 DIAGNOSIS — J4520 Mild intermittent asthma, uncomplicated: Secondary | ICD-10-CM | POA: Diagnosis not present

## 2022-08-16 MED ORDER — FLUTICASONE PROPIONATE 50 MCG/ACT NA SUSP: 2.0000 | Freq: Every day | NASAL | 6 refills | Status: DC

## 2022-08-16 MED ORDER — ALBUTEROL SULFATE HFA 108 (90 BASE) MCG/ACT IN AERS: 2.0000 | INHALATION_SPRAY | Freq: Four times a day (QID) | RESPIRATORY_TRACT | 0 refills | Status: DC | PRN

## 2022-08-16 MED ORDER — AZITHROMYCIN 250 MG PO TABS: ORAL_TABLET | ORAL | 0 refills | Status: AC

## 2022-08-16 NOTE — Progress Notes (Signed)
Virtual Visit Consent   George D Shugart, you are scheduled for a virtual visit with a [facility] provider today. Just as with appointments in the office, your consent must be obtained to participate. Your consent will be active for this visit and any virtual visit you may have with one of our providers in the next 365 days. If you have a MyChart account, a copy of this consent can be sent to you electronically.  As this is a virtual visit, video technology does not allow for your provider to perform a traditional examination. This may limit your provider's ability to fully assess your condition. If your provider identifies any concerns that need to be evaluated in person or the need to arrange testing (such as labs, EKG, etc.), we will make arrangements to do so. Although advances in technology are sophisticated, we cannot ensure that it will always work on either your end or our end. If the connection with a video visit is poor, the visit may have to be switched to a telephone visit. With either a video or telephone visit, we are not always able to ensure that we have a secure connection.  By engaging in this virtual visit, you consent to the provision of healthcare and authorize for your insurance to be billed (if applicable) for the services provided during this visit. Depending on your insurance coverage, you may receive a charge related to this service.  I need to obtain your verbal consent now. Are you willing to proceed with your visit today? George D Dearmond has provided verbal consent on 08/16/2022 for a virtual visit (video or telephone). Apolonio Schneiders, FNP  Date: 08/16/2022 7:11 PM  Virtual Visit via Video Note   I, Apolonio Schneiders, connected with  George D Talcott  ([id number], [birth year]) on 08/16/22 at  7:15 PM EST by a video-enabled telemedicine application and verified that I am speaking with the correct person using two identifiers.  Location: Patient: Virtual Visit Location Patient:  Home Provider: Virtual Visit Location Provider: Office/Clinic   I discussed the limitations of evaluation and management by telemedicine and the availability of in person appointments. The patient expressed understanding and agreed to proceed.    History of Present Illness: George Rodriguez is a 32 y.o. who identifies as a male who was assigned male at birth, and is being seen today for [place].  Symptom onset was 5 days ago Headache, cough, congestion, fever, cough was initially dry but has become productive  Left ear pain and decreased hearing started today   He has had COVID three times in the past without complication History of asthma  Does not currently have inhaler - ran out last month    Denies history of ear infections in the past 10 years He has been taking Tylenol over the counter    Problems:  Patient Active Problem List   Diagnosis Date Noted   Asthma 07/13/2017   Allergic rhinitis 07/13/2017    Allergies: No Known Allergies Medications:  Current Outpatient Medications:    albuterol (VENTOLIN HFA) 108 (90 Base) MCG/ACT inhaler, INHALE 1-2 PUFFS INTO THE LUNGS EVERY 4 HOURS AS NEEDED FOR WHEEZING OR SHORTNESS OF BREATH. Please call for an appointment for more refills, Disp: 6.7 each, Rfl: 0   BREO ELLIPTA 100-25 MCG/INH AEPB, INHALE 1 PUFF BY MOUTH EVERY DAY, Disp: 60 each, Rfl: 0   clobetasol (OLUX) 0.05 % topical foam, Apply to more severe areas eczema once to twice a day until improved. Avoid face,  groin, underarms., Disp: 100 g, Rfl: 2   Clobetasol Propionate Emulsion 0.05 % topical foam, Apply BID to affected areas on body up to 2 weeks as needed. Avoid applying to face, groin, and axilla., Disp: 100 g, Rfl: 2   dupilumab (DUPIXENT) 300 MG/2ML prefilled syringe, Inject 300 mg into the skin every 14 (fourteen) days., Disp: 4 mL, Rfl: 5   Ruxolitinib Phosphate (OPZELURA) 1.5 % CREA, Apply 1 application  topically in the morning and at bedtime. Apply to affected areas of  face and body., Disp: 60 g, Rfl: 3   terbinafine (LAMISIL) 250 MG tablet, Take 1 tablet (250 mg total) by mouth daily. Take with food, Disp: 30 tablet, Rfl: 1   triamcinolone (NASACORT) 55 MCG/ACT AERO nasal inhaler, Place 2 sprays into the nose daily., Disp: , Rfl:   Observations/Objective: Patient is well-developed, well-nourished in no acute distress.  Resting comfortably  at home.  Head is normocephalic, atraumatic.  No labored breathing.  Speech is clear and coherent with logical content.  Patient is alert and oriented at baseline.    Assessment and Plan: 1. COVID-19 Likely secondary infection to lower respiratory tract  - azithromycin (ZITHROMAX) 250 MG tablet; Take 2 tablets on day 1, then 1 tablet daily on days 2 through 5  Dispense: 6 tablet; Refill: 0 - fluticasone (FLONASE) 50 MCG/ACT nasal spray; Place 2 sprays into both nostrils daily.  Dispense: 16 g; Refill: 6  2. Mild intermittent asthma, unspecified whether complicated  - albuterol (VENTOLIN HFA) 108 (90 Base) MCG/ACT inhaler; Inhale 2 puffs into the lungs every 6 (six) hours as needed for wheezing or shortness of breath.  Dispense: 8 g; Refill: 0     Follow Up Instructions: I discussed the assessment and treatment plan with the patient. The patient was provided an opportunity to ask questions and all were answered. The patient agreed with the plan and demonstrated an understanding of the instructions.  A copy of instructions were sent to the patient via MyChart unless otherwise noted below.    The patient was advised to call back or seek an in-person evaluation if the symptoms worsen or if the condition fails to improve as anticipated.  Time:  I spent 10 minutes with the patient via telehealth technology discussing the above problems/concerns.    Viviano Simas, FNP

## 2022-09-07 ENCOUNTER — Other Ambulatory Visit: Payer: Self-pay | Admitting: Nurse Practitioner

## 2022-09-07 DIAGNOSIS — J4520 Mild intermittent asthma, uncomplicated: Secondary | ICD-10-CM

## 2022-09-26 ENCOUNTER — Telehealth: Payer: BC Managed Care – PPO | Admitting: Nurse Practitioner

## 2022-09-26 DIAGNOSIS — R1084 Generalized abdominal pain: Secondary | ICD-10-CM | POA: Diagnosis not present

## 2022-09-26 NOTE — Patient Instructions (Signed)
George Rodriguez, thank you for joining Gildardo Pounds, NP for today's virtual visit.  While this provider is not your primary care provider (PCP), if your PCP is located in our provider database this encounter information will be shared with them immediately following your visit.   [facility] account gives you access to today's visit and all your visits, tests, and labs performed at [facility] " click here if you don't have a [facility] account or go to [email or web address]  Consent: (Patient) George D Enamorado provided verbal consent for this virtual visit at the beginning of the encounter.  Current Medications:  Current Outpatient Medications:    albuterol (VENTOLIN HFA) 108 (90 Base) MCG/ACT inhaler, INHALE 1-2 PUFFS INTO THE LUNGS EVERY 4 HOURS AS NEEDED FOR WHEEZING OR SHORTNESS OF BREATH. Please call for an appointment for more refills, Disp: 6.7 each, Rfl: 0   albuterol (VENTOLIN HFA) 108 (90 Base) MCG/ACT inhaler, Inhale 2 puffs into the lungs every 6 (six) hours as needed for wheezing or shortness of breath., Disp: 8 g, Rfl: 0   BREO ELLIPTA 100-25 MCG/INH AEPB, INHALE 1 PUFF BY MOUTH EVERY DAY, Disp: 60 each, Rfl: 0   clobetasol (OLUX) 0.05 % topical foam, Apply to more severe areas eczema once to twice a day until improved. Avoid face, groin, underarms., Disp: 100 g, Rfl: 2   Clobetasol Propionate Emulsion 0.05 % topical foam, Apply BID to affected areas on body up to 2 weeks as needed. Avoid applying to face, groin, and axilla., Disp: 100 g, Rfl: 2   dupilumab (DUPIXENT) 300 MG/2ML prefilled syringe, Inject 300 mg into the skin every 14 (fourteen) days., Disp: 4 mL, Rfl: 5   fluticasone (FLONASE) 50 MCG/ACT nasal spray, Place 2 sprays into both nostrils daily., Disp: 16 g, Rfl: 6   Ruxolitinib Phosphate (OPZELURA) 1.5 % CREA, Apply 1 application  topically in the morning and at bedtime. Apply to affected areas of face and body., Disp: 60 g, Rfl: 3    terbinafine (LAMISIL) 250 MG tablet, Take 1 tablet (250 mg total) by mouth daily. Take with food, Disp: 30 tablet, Rfl: 1   triamcinolone (NASACORT) 55 MCG/ACT AERO nasal inhaler, Place 2 sprays into the nose daily., Disp: , Rfl:    Medications ordered in this encounter:  No orders of the defined types were placed in this encounter.    *If you need refills on other medications prior to your next appointment, please contact your pharmacy*  Follow-Up: Call back or seek an in-person evaluation if the symptoms worsen or if the condition fails to improve as anticipated.  [facility] [phone]  Other Instructions Follow up for face to face exam, labs and possible imagine in urgent care due to fever and abdominal pain.    If you have been instructed to have an in-person evaluation today at a local Urgent Care facility, please use the link below. It will take you to a list of all of our available [facility], including address, phone number and hours of operation. Please do not delay care.  [facility]  If you or a family member do not have a primary care provider, use the link below to schedule a visit and establish care. When you choose a [facility] primary care physician or advanced practice provider, you gain a long-term partner in health. Find a Primary Care Provider  Learn more about [facility]'s in-office and virtual care options:  [facility]

## 2022-09-26 NOTE — Progress Notes (Signed)
Virtual Visit Consent   George Rodriguez, you are scheduled for a virtual visit with a [facility] provider today. Just as with appointments in the office, your consent must be obtained to participate. Your consent will be active for this visit and any virtual visit you may have with one of our providers in the next 365 days. If you have a MyChart account, a copy of this consent can be sent to you electronically.  As this is a virtual visit, video technology does not allow for your provider to perform a traditional examination. This may limit your provider's ability to fully assess your condition. If your provider identifies any concerns that need to be evaluated in person or the need to arrange testing (such as labs, EKG, etc.), we will make arrangements to do so. Although advances in technology are sophisticated, we cannot ensure that it will always work on either your end or our end. If the connection with a video visit is poor, the visit may have to be switched to a telephone visit. With either a video or telephone visit, we are not always able to ensure that we have a secure connection.  By engaging in this virtual visit, you consent to the provision of healthcare and authorize for your insurance to be billed (if applicable) for the services provided during this visit. Depending on your insurance coverage, you may receive a charge related to this service.  I need to obtain your verbal consent now. Are you willing to proceed with your visit today? George Rodriguez has provided verbal consent on 09/26/2022 for a virtual visit (video or telephone). Gildardo Pounds, NP  Date: 09/26/2022 3:21 PM  Virtual Visit via Video Note   I, Gildardo Pounds, connected with  George Rodriguez  ([id number], 10-Sep-1990) on 09/26/22 at  3:15 PM EST by a video-enabled telemedicine application and verified that I am speaking with the correct person using two identifiers.  Location: Patient: Virtual Visit Location Patient:  Home Provider: Virtual Visit Location Provider: Home Office   I discussed the limitations of evaluation and management by telemedicine and the availability of in person appointments. The patient expressed understanding and agreed to proceed.    History of Present Illness: George Rodriguez is a 32 y.o. who identifies as a male who was assigned male at birth, and is being seen today for abdominal pain.  The pain is described as aching and sharp. Pain is located in the lower abdomen but started off initially in the LLQ. [place] was 2 days ago. Symptoms have been unchanged since. Aggravating factors: none.  Alleviating factors: none. Associated symptoms: fever and mucus in stool. The patient denies dysuria, hematochezia, hematuria, and melena or diarrhea.States pain has been occurring since he went out to dinner 2 nights ago. Ibuprofen has not provided much improvement in pain.   Problems:  Patient Active Problem List   Diagnosis Date Noted   Asthma 07/13/2017   Allergic rhinitis 07/13/2017    Allergies: No Known Allergies Medications:  Current Outpatient Medications:    albuterol (VENTOLIN HFA) 108 (90 Base) MCG/ACT inhaler, INHALE 1-2 PUFFS INTO THE LUNGS EVERY 4 HOURS AS NEEDED FOR WHEEZING OR SHORTNESS OF BREATH. Please call for an appointment for more refills, Disp: 6.7 each, Rfl: 0   albuterol (VENTOLIN HFA) 108 (90 Base) MCG/ACT inhaler, Inhale 2 puffs into the lungs every 6 (six) hours as needed for wheezing or shortness of breath., Disp: 8 g, Rfl: 0   BREO  ELLIPTA 100-25 MCG/INH AEPB, INHALE 1 PUFF BY MOUTH EVERY DAY, Disp: 60 each, Rfl: 0   clobetasol (OLUX) 0.05 % topical foam, Apply to more severe areas eczema once to twice a day until improved. Avoid face, groin, underarms., Disp: 100 g, Rfl: 2   Clobetasol Propionate Emulsion 0.05 % topical foam, Apply BID to affected areas on body up to 2 weeks as needed. Avoid applying to face, groin, and axilla., Disp: 100 g, Rfl: 2   dupilumab  (DUPIXENT) 300 MG/2ML prefilled syringe, Inject 300 mg into the skin every 14 (fourteen) days., Disp: 4 mL, Rfl: 5   fluticasone (FLONASE) 50 MCG/ACT nasal spray, Place 2 sprays into both nostrils daily., Disp: 16 g, Rfl: 6   Ruxolitinib Phosphate (OPZELURA) 1.5 % CREA, Apply 1 application  topically in the morning and at bedtime. Apply to affected areas of face and body., Disp: 60 g, Rfl: 3   terbinafine (LAMISIL) 250 MG tablet, Take 1 tablet (250 mg total) by mouth daily. Take with food, Disp: 30 tablet, Rfl: 1   triamcinolone (NASACORT) 55 MCG/ACT AERO nasal inhaler, Place 2 sprays into the nose daily., Disp: , Rfl:   Observations/Objective: Patient is well-developed, well-nourished in no acute distress.  Resting comfortably at home.  Head is normocephalic, atraumatic.  No labored breathing.  Speech is clear and coherent with logical content.  Patient is alert and oriented at baseline.    Assessment and Plan: 1. Generalized abdominal pain Follow up for face to face exam, labs and possible imagine in urgent care due to fever and abdominal pain.   Follow Up Instructions: I discussed the assessment and treatment plan with the patient. The patient was provided an opportunity to ask questions and all were answered. The patient agreed with the plan and demonstrated an understanding of the instructions.  A copy of instructions were sent to the patient via MyChart unless otherwise noted below.    The patient was advised to call back or seek an in-person evaluation if the symptoms worsen or if the condition fails to improve as anticipated.  Time:  I spent 12 minutes with the patient via telehealth technology discussing the above problems/concerns.    Gildardo Pounds, NP

## 2022-09-27 ENCOUNTER — Emergency Department (HOSPITAL_COMMUNITY): Payer: BC Managed Care – PPO

## 2022-09-27 ENCOUNTER — Inpatient Hospital Stay (HOSPITAL_COMMUNITY)
Admission: EM | Admit: 2022-09-27 | Discharge: 2022-10-08 | DRG: 330 | Disposition: A | Discharge status: Home Health | Payer: BC Managed Care – PPO | Attending: Surgery | Admitting: Surgery

## 2022-09-27 ENCOUNTER — Encounter (HOSPITAL_COMMUNITY): Payer: Self-pay

## 2022-09-27 ENCOUNTER — Other Ambulatory Visit: Payer: Self-pay

## 2022-09-27 DIAGNOSIS — K5792 Diverticulitis of intestine, part unspecified, without perforation or abscess without bleeding: Secondary | ICD-10-CM | POA: Diagnosis present

## 2022-09-27 DIAGNOSIS — Z83438 Family history of other disorder of lipoprotein metabolism and other lipidemia: Secondary | ICD-10-CM

## 2022-09-27 DIAGNOSIS — L0100 Impetigo, unspecified: Secondary | ICD-10-CM | POA: Diagnosis present

## 2022-09-27 DIAGNOSIS — Z8249 Family history of ischemic heart disease and other diseases of the circulatory system: Secondary | ICD-10-CM | POA: Diagnosis not present

## 2022-09-27 DIAGNOSIS — R309 Painful micturition, unspecified: Secondary | ICD-10-CM | POA: Diagnosis present

## 2022-09-27 DIAGNOSIS — K5720 Diverticulitis of large intestine with perforation and abscess without bleeding: Secondary | ICD-10-CM | POA: Diagnosis present

## 2022-09-27 DIAGNOSIS — J45909 Unspecified asthma, uncomplicated: Secondary | ICD-10-CM | POA: Diagnosis present

## 2022-09-27 DIAGNOSIS — R319 Hematuria, unspecified: Secondary | ICD-10-CM | POA: Diagnosis present

## 2022-09-27 DIAGNOSIS — K660 Peritoneal adhesions (postprocedural) (postinfection): Secondary | ICD-10-CM | POA: Diagnosis present

## 2022-09-27 DIAGNOSIS — F17220 Nicotine dependence, chewing tobacco, uncomplicated: Secondary | ICD-10-CM | POA: Diagnosis present

## 2022-09-27 DIAGNOSIS — F419 Anxiety disorder, unspecified: Secondary | ICD-10-CM | POA: Diagnosis present

## 2022-09-27 DIAGNOSIS — R188 Other ascites: Secondary | ICD-10-CM | POA: Diagnosis present

## 2022-09-27 DIAGNOSIS — K5732 Diverticulitis of large intestine without perforation or abscess without bleeding: Principal | ICD-10-CM

## 2022-09-27 DIAGNOSIS — K567 Ileus, unspecified: Secondary | ICD-10-CM | POA: Diagnosis not present

## 2022-09-27 DIAGNOSIS — Z79899 Other long term (current) drug therapy: Secondary | ICD-10-CM

## 2022-09-27 DIAGNOSIS — L309 Dermatitis, unspecified: Secondary | ICD-10-CM | POA: Diagnosis present

## 2022-09-27 LAB — URINALYSIS, ROUTINE W REFLEX MICROSCOPIC
Glucose, UA: NEGATIVE mg/dL
Ketones, ur: 15 mg/dL — AB
Leukocytes,Ua: NEGATIVE
Nitrite: NEGATIVE
Protein, ur: 30 mg/dL — AB
Specific Gravity, Urine: 1.025 (ref 1.005–1.030)
pH: 6 (ref 5.0–8.0)

## 2022-09-27 LAB — COMPREHENSIVE METABOLIC PANEL
ALT: 34 U/L (ref 0–44)
AST: 20 U/L (ref 15–41)
Albumin: 3.8 g/dL (ref 3.5–5.0)
Alkaline Phosphatase: 80 U/L (ref 38–126)
Anion gap: 10 (ref 5–15)
BUN: 7 mg/dL (ref 6–20)
CO2: 25 mmol/L (ref 22–32)
Calcium: 9.3 mg/dL (ref 8.9–10.3)
Chloride: 98 mmol/L (ref 98–111)
Creatinine, Ser: 1.04 mg/dL (ref 0.61–1.24)
GFR, Estimated: >60 mL/min (ref >60)
Glucose, Bld: 130 mg/dL — ABNORMAL HIGH (ref 70–99)
Potassium: 3.7 mmol/L (ref 3.5–5.1)
Sodium: 133 mmol/L — ABNORMAL LOW (ref 135–145)
Total Bilirubin: 1.2 mg/dL (ref 0.3–1.2)
Total Protein: 7.7 g/dL (ref 6.5–8.1)

## 2022-09-27 LAB — LIPASE, BLOOD: Lipase: 33 U/L (ref 11–51)

## 2022-09-27 LAB — CBC
HCT: 47.8 % (ref 39.0–52.0)
Hemoglobin: 16.8 g/dL (ref 13.0–17.0)
MCH: 31.8 pg (ref 26.0–34.0)
MCHC: 35.1 g/dL (ref 30.0–36.0)
MCV: 90.4 fL (ref 80.0–100.0)
Platelets: 297 10*3/uL (ref 150–400)
RBC: 5.29 MIL/uL (ref 4.22–5.81)
RDW: 12.4 % (ref 11.5–15.5)
WBC: 15.8 10*3/uL — ABNORMAL HIGH (ref 4.0–10.5)
nRBC: 0 % (ref 0.0–0.2)

## 2022-09-27 LAB — URINALYSIS, MICROSCOPIC (REFLEX): Squamous Epithelial / HPF: NONE SEEN /HPF (ref 0–5)

## 2022-09-27 MED ORDER — ENOXAPARIN SODIUM 40 MG/0.4ML IJ SOSY
40.0000 mg | PREFILLED_SYRINGE | INTRAMUSCULAR | Status: DC
  Administered 2022-09-28 – 2022-09-30 (×3): 40 mg via SUBCUTANEOUS
  Filled 2022-09-27 (×3): qty 0.4

## 2022-09-27 MED ORDER — SIMETHICONE 80 MG PO CHEW
40.0000 mg | CHEWABLE_TABLET | Freq: Four times a day (QID) | ORAL | Status: DC | PRN
  Administered 2022-09-30: 40 mg via ORAL
  Filled 2022-09-27: qty 1

## 2022-09-27 MED ORDER — ACETAMINOPHEN 500 MG PO TABS
1000.0000 mg | ORAL_TABLET | Freq: Four times a day (QID) | ORAL | Status: DC
  Administered 2022-09-28 – 2022-10-01 (×12): 1000 mg via ORAL
  Filled 2022-09-27 (×13): qty 2

## 2022-09-27 MED ORDER — ALBUTEROL SULFATE (2.5 MG/3ML) 0.083% IN NEBU: 3.0000 mL | INHALATION_SOLUTION | RESPIRATORY_TRACT | Status: DC | PRN

## 2022-09-27 MED ORDER — IBUPROFEN 600 MG PO TABS
600.0000 mg | ORAL_TABLET | Freq: Four times a day (QID) | ORAL | Status: DC | PRN
  Administered 2022-09-30 (×2): 600 mg via ORAL
  Filled 2022-09-27 (×2): qty 1

## 2022-09-27 MED ORDER — IOHEXOL 350 MG/ML SOLN
100.0000 mL | Freq: Once | INTRAVENOUS | Status: AC | PRN
  Administered 2022-09-27: 100 mL via INTRAVENOUS

## 2022-09-27 MED ORDER — ONDANSETRON HCL 4 MG/2ML IJ SOLN: 4.0000 mg | Freq: Four times a day (QID) | INTRAMUSCULAR | Status: DC | PRN

## 2022-09-27 MED ORDER — TRIAMCINOLONE ACETONIDE 55 MCG/ACT NA AERO: 2.0000 | INHALATION_SPRAY | Freq: Every day | NASAL | Status: DC

## 2022-09-27 MED ORDER — KETOROLAC TROMETHAMINE 30 MG/ML IJ SOLN
30.0000 mg | Freq: Once | INTRAMUSCULAR | Status: AC
  Administered 2022-09-27: 30 mg via INTRAVENOUS
  Filled 2022-09-27: qty 1

## 2022-09-27 MED ORDER — TERBINAFINE HCL 250 MG PO TABS
250.0000 mg | ORAL_TABLET | Freq: Every day | ORAL | Status: DC
  Administered 2022-09-28 – 2022-09-29 (×2): 250 mg via ORAL
  Filled 2022-09-27 (×4): qty 1

## 2022-09-27 MED ORDER — OXYCODONE HCL 5 MG PO TABS
5.0000 mg | ORAL_TABLET | ORAL | Status: DC | PRN
  Administered 2022-09-28 – 2022-10-01 (×10): 10 mg via ORAL
  Filled 2022-09-27 (×10): qty 2

## 2022-09-27 MED ORDER — PIPERACILLIN-TAZOBACTAM 3.375 G IVPB: 3.3750 g | Freq: Three times a day (TID) | INTRAVENOUS | Status: DC

## 2022-09-27 MED ORDER — ONDANSETRON HCL 4 MG/2ML IJ SOLN
4.0000 mg | Freq: Once | INTRAMUSCULAR | Status: AC
  Administered 2022-09-27: 4 mg via INTRAVENOUS
  Filled 2022-09-27: qty 2

## 2022-09-27 MED ORDER — MORPHINE SULFATE (PF) 4 MG/ML IV SOLN
4.0000 mg | Freq: Once | INTRAVENOUS | Status: AC
  Administered 2022-09-27: 4 mg via INTRAVENOUS
  Filled 2022-09-27: qty 1

## 2022-09-27 MED ORDER — METRONIDAZOLE 500 MG/100ML IV SOLN: 500.0000 mg | Freq: Three times a day (TID) | INTRAVENOUS | Status: DC

## 2022-09-27 MED ORDER — DIPHENHYDRAMINE HCL 12.5 MG/5ML PO ELIX: 12.5000 mg | ORAL_SOLUTION | Freq: Four times a day (QID) | ORAL | Status: DC | PRN

## 2022-09-27 MED ORDER — FLUTICASONE FUROATE-VILANTEROL 100-25 MCG/ACT IN AEPB: 1.0000 | INHALATION_SPRAY | Freq: Every day | RESPIRATORY_TRACT | Status: DC

## 2022-09-27 MED ORDER — FLUTICASONE PROPIONATE 50 MCG/ACT NA SUSP
2.0000 | Freq: Every day | NASAL | Status: DC
  Administered 2022-09-29 – 2022-09-30 (×2): 2 via NASAL
  Filled 2022-09-27: qty 16

## 2022-09-27 MED ORDER — RUXOLITINIB PHOSPHATE 1.5 % EX CREA: 1.0000 "application " | TOPICAL_CREAM | Freq: Two times a day (BID) | CUTANEOUS | Status: DC

## 2022-09-27 MED ORDER — HYDROMORPHONE HCL 1 MG/ML IJ SOLN
0.5000 mg | INTRAMUSCULAR | Status: DC | PRN
  Administered 2022-09-28 – 2022-10-01 (×5): 0.5 mg via INTRAVENOUS
  Filled 2022-09-27 (×5): qty 0.5

## 2022-09-27 MED ORDER — ONDANSETRON 4 MG PO TBDP: 4.0000 mg | ORAL_TABLET | Freq: Four times a day (QID) | ORAL | Status: DC | PRN

## 2022-09-27 MED ORDER — CIPROFLOXACIN IN D5W 400 MG/200ML IV SOLN
400.0000 mg | Freq: Two times a day (BID) | INTRAVENOUS | Status: DC
  Administered 2022-09-27: 400 mg via INTRAVENOUS
  Filled 2022-09-27: qty 200

## 2022-09-27 MED ORDER — CLOBETASOL PROPIONATE 0.05 % EX CREA: 1.0000 | TOPICAL_CREAM | Freq: Two times a day (BID) | CUTANEOUS | Status: DC | PRN

## 2022-09-27 MED ORDER — TRAMADOL HCL 50 MG PO TABS
50.0000 mg | ORAL_TABLET | Freq: Four times a day (QID) | ORAL | Status: DC | PRN
  Administered 2022-09-30 (×2): 50 mg via ORAL
  Filled 2022-09-27 (×2): qty 1

## 2022-09-27 MED ORDER — LACTATED RINGERS IV SOLN: INTRAVENOUS | Status: DC

## 2022-09-27 MED ORDER — HYDRALAZINE HCL 20 MG/ML IJ SOLN: 10.0000 mg | INTRAMUSCULAR | Status: DC | PRN

## 2022-09-27 MED ORDER — DIPHENHYDRAMINE HCL 50 MG/ML IJ SOLN: 12.5000 mg | Freq: Four times a day (QID) | INTRAMUSCULAR | Status: DC | PRN

## 2022-09-27 NOTE — ED Triage Notes (Addendum)
Pt reports abd pain since Saturday morning but worsened yesterday associated with hematuria, fever, chills, nausea, and constipation. LBM today at 10am

## 2022-09-27 NOTE — ED Provider Triage Note (Signed)
Emergency Medicine Provider Triage Evaluation Note  [place] D Melfi , a 32 y.o. male  was evaluated in triage.  Pt complains of abdominal pain and fever x 2 days. Started in LLQ, moved to lower abdomen. Highest temp of 102.4 F. Had some right flank pain last week after coughing, but that has resolved.  Review of Systems  Positive: Abd pain, fever, hematuria, diarrhea Negative: Nausea, vomiting, dysuria  Physical Exam  BP (!) 151/91   Pulse (!) 111   Temp 99.3 F (37.4 C)   Resp 20   Ht 6' 1"$  (1.854 m)   Wt 120.2 kg   SpO2 96%   BMI 34.96 kg/m  Gen:   Awake, no distress   Resp:  Normal effort  MSK:   Moves extremities without difficulty  Other:    Medical Decision Making  Medically screening exam initiated at 5:46 PM.  Appropriate orders placed.  [place] D Laker was informed that the remainder of the evaluation will be completed by another provider, this initial triage assessment does not replace that evaluation, and the importance of remaining in the ED until their evaluation is complete.  Workup initiated   Kateri Plummer, PA-C 09/27/22 1751

## 2022-09-27 NOTE — ED Provider Notes (Signed)
[facility] Provider Note   CSN: [id number] Arrival date & time: 09/27/22  1635     History  Chief Complaint  Patient presents with   Abdominal Pain   Hematuria    George Rodriguez is a 32 y.o. male presenting emergency department with abdominal pain.  This began about 2 days ago.  He reports painful urination and hematuria and cramping lower abdominal pain, abdominal bloating, loss of appetite.  No bowel movement in 2 days.  He is passing gas.  He reports a history of an appendectomy but no other abdominal surgeries.  HPI     Home Medications Prior to Admission medications   Medication Sig Start Date End Date Taking? Authorizing Provider  albuterol (VENTOLIN HFA) 108 (90 Base) MCG/ACT inhaler INHALE 1-2 PUFFS INTO THE LUNGS EVERY 4 HOURS AS NEEDED FOR WHEEZING OR SHORTNESS OF BREATH. Please call for an appointment for more refills 07/01/20   Park Liter P, DO  albuterol (VENTOLIN HFA) 108 (90 Base) MCG/ACT inhaler Inhale 2 puffs into the lungs every 6 (six) hours as needed for wheezing or shortness of breath. 08/16/22   Apolonio Schneiders, FNP  BREO ELLIPTA 100-25 MCG/INH AEPB INHALE 1 PUFF BY MOUTH EVERY DAY 07/06/20   Park Liter P, DO  clobetasol (OLUX) 0.05 % topical foam Apply to more severe areas eczema once to twice a day until improved. Avoid face, groin, underarms. 04/26/22   Brendolyn Patty, MD  Clobetasol Propionate Emulsion 0.05 % topical foam Apply BID to affected areas on body up to 2 weeks as needed. Avoid applying to face, groin, and axilla. 10/21/21   Brendolyn Patty, MD  dupilumab (DUPIXENT) 300 MG/2ML prefilled syringe Inject 300 mg into the skin every 14 (fourteen) days. 04/26/22   Brendolyn Patty, MD  fluticasone [facility]) 50 MCG/ACT nasal spray Place 2 sprays into both nostrils daily. 08/16/22   Apolonio Schneiders, FNP  Ruxolitinib Phosphate (OPZELURA) 1.5 % CREA Apply 1 application  topically in the morning and at bedtime. Apply to  affected areas of face and body. 04/26/22   Brendolyn Patty, MD  terbinafine (LAMISIL) 250 MG tablet Take 1 tablet (250 mg total) by mouth daily. Take with food 09/16/20   Brendolyn Patty, MD  triamcinolone (NASACORT) 55 MCG/ACT AERO nasal inhaler Place 2 sprays into the nose daily.    [provider]      Allergies    Patient has no known allergies.    Review of Systems   Review of Systems  Physical Exam Updated Vital Signs BP 134/76 (BP Location: Right Arm)   Pulse 96   Temp 99.6 F (37.6 C) (Oral)   Resp 20   Ht 6' 1"$  (1.854 m)   Wt 120.2 kg   SpO2 96%   BMI 34.96 kg/m  Physical Exam Constitutional:      General: He is not in acute distress. HENT:     Head: Normocephalic and atraumatic.  Eyes:     Conjunctiva/sclera: Conjunctivae normal.     Pupils: Pupils are equal, round, and reactive to light.  Cardiovascular:     Rate and Rhythm: Normal rate and regular rhythm.  Pulmonary:     Effort: Pulmonary effort is normal. No respiratory distress.  Abdominal:     General: There is no distension.     Tenderness: There is abdominal tenderness in the right lower quadrant, suprapubic area and left lower quadrant.  Skin:    General: Skin is warm and dry.  Neurological:     General: No focal deficit present.     Mental Status: He is alert. Mental status is at baseline.  Psychiatric:        Mood and Affect: Mood normal.        Behavior: Behavior normal.     ED Results / Procedures / Treatments   Labs (all labs ordered are listed, but only abnormal results are displayed) Labs Reviewed  COMPREHENSIVE METABOLIC PANEL - Abnormal; Notable for the following components:      Result Value   Sodium 133 (*)    Glucose, Bld 130 (*)    All other components within normal limits  CBC - Abnormal; Notable for the following components:   WBC 15.8 (*)    All other components within normal limits  URINALYSIS, ROUTINE W REFLEX MICROSCOPIC - Abnormal; Notable for the following  components:   Hgb urine dipstick SMALL (*)    Bilirubin Urine SMALL (*)    Ketones, ur 15 (*)    Protein, ur 30 (*)    All other components within normal limits  URINALYSIS, MICROSCOPIC (REFLEX) - Abnormal; Notable for the following components:   Bacteria, UA RARE (*)    All other components within normal limits  LIPASE, BLOOD    EKG None  Radiology CT ABDOMEN PELVIS W CONTRAST  Addendum Date: 09/27/2022   ADDENDUM REPORT: 09/27/2022 22:06 ADDENDUM: Findings were discussed with and acknowledged by Dr. Langston Masker. Electronically Signed   By: Sammie Bench M.D.   On: 09/27/2022 22:06   Result Date: 09/27/2022 CLINICAL DATA:  Diverticulitis EXAM: CT ABDOMEN AND PELVIS WITH CONTRAST TECHNIQUE: Multidetector CT imaging of the abdomen and pelvis was performed using the standard protocol following bolus administration of intravenous contrast. RADIATION DOSE REDUCTION: This exam was performed according to the departmental dose-optimization program which includes automated exposure control, adjustment of the mA and/or kV according to patient size and/or use of iterative reconstruction technique. CONTRAST:  100 mL OMNIPAQUE IOHEXOL 350 MG/ML SOLN COMPARISON:  08/20/2006 FINDINGS: Lower chest: No acute abnormality. Hepatobiliary: No focal liver abnormality is seen. No gallstones, gallbladder wall thickening, or biliary dilatation. Pancreas: Unremarkable. No pancreatic ductal dilatation or surrounding inflammatory changes. Spleen: Prominent, 14.5 cm with a homogeneous texture. Adrenals/Urinary Tract: Adrenal glands are unremarkable. Kidneys are normal, without renal calculi, focal lesion, or hydronephrosis. Bladder is unremarkable. Stomach/Bowel: No bowel dilatation to suggest obstruction. Appendix has been removed. There is diverticulosis and diverticulitis of the sigmoid with loculated Peridiverticular perforation and extraluminal air noted in the sigmoid mesentery. There is non loculated free air  identified as well. No discrete drainable abscess collection identified. Vascular/Lymphatic: No significant vascular findings are present. No enlarged abdominal or pelvic lymph nodes. Reproductive: Prostate is unremarkable. Other: No abdominal wall hernia or abnormality. No abdominopelvic ascites. Musculoskeletal: No acute or significant osseous findings. IMPRESSION: 1. Perforated sigmoid diverticulitis. 2. No discrete drainable abscess collection. 3. Mild splenomegaly. Electronically Signed: By: Sammie Bench M.D. On: 09/27/2022 21:57    Procedures Procedures    Medications Ordered in ED Medications  piperacillin-tazobactam (ZOSYN) IVPB 3.375 g (has no administration in time range)  ketorolac (TORADOL) 30 MG/ML injection 30 mg (30 mg Intravenous Given 09/27/22 1840)  iohexol (OMNIPAQUE) 350 MG/ML injection 100 mL (100 mLs Intravenous Contrast Given 09/27/22 2136)  morphine (PF) 4 MG/ML injection 4 mg (4 mg Intravenous Given 09/27/22 2220)  ondansetron (ZOFRAN) injection 4 mg (4 mg Intravenous Given 09/27/22 2218)    ED Course/ Medical Decision Making/ A&P Clinical Course  as of 09/27/22 2302  Mon Sep 27, 2022  2212 General surgery paged.  Patient updated regarding diagnosis.  He is having more discomfort I have ordered morphine as well as antibiotics [MT]  2235 Dr Dema Severin general surgery to admit [MT]  2236 Per our conversation, Dr Dema Severin prefers IV Zosyn to ciprofloxacin and Flagyl.  These antibiotics will be discontinued and Zosyn has been ordered per pharmacy. [MT]    Clinical Course User Index [MT] Kentrell Guettler, Carola Rhine, MD                             Medical Decision Making Amount and/or Complexity of Data Reviewed Radiology: ordered.  Risk Prescription drug management. Decision regarding hospitalization.   This patient presents to the Emergency Department with complaint of abdominal pain. This involves an extensive number of treatment options, and is a complaint that carries with it  a high risk of complications and morbidity.  The differential diagnosis includes, but is not limited to, gastritis vs biliary disease vs peptic ulcer vs constipation vs colitis vs UTI vs other  I ordered, reviewed, and interpreted labs, notable for leukocytosis I ordered medication Toradol for abdominal pain and/or nausea I ordered imaging studies which included CT abdomen pelvis I independently visualized and interpreted imaging which showed sigmoid diverticulitis with concern for perforation and the monitor tracing which showed normal sinus rhythm  General surgery was consulted and will see the patient for medical admission.  Patient remained stable at the time of admission.  No evidence of septic shock.  IV antibiotics ordered, Zosyn per discussion with general surgery.          Final Clinical Impression(s) / ED Diagnoses Final diagnoses:  Sigmoid diverticulitis    Rx / DC Orders ED Discharge Orders     None         Wyvonnia Dusky, MD 09/27/22 2302

## 2022-09-27 NOTE — H&P (Signed)
CC: LLQ pain  HPI: George Rodriguez is an 32 y.o. male with hx eczema, presented to [facility] ED with 2d hx of LLQ abdominal pain.  Reports some apparent blood in his urine and some pain with urination.  Last bowel was 2 days ago.  He has been having flatus.  Does have some loss of appetite but no vomiting.  He reports that he never had this exact kind of pain but he does feel somewhat similar to when he had appendicitis 15 years ago.  Went to urgent care this morning and was referred to the ER.  He contemplated actually going back home.  Reports a history of appendectomy = laparoscopic.  This was at [facility] and he reports that he had no evident evisceration that required repair on postop day #1.  Denies any other abdominal or pelvic surgery.  His fiance is at bedside.  Past Medical History:  Diagnosis Date   Anxiety    Asthma    Dermatitis    Eczema     Past Surgical History:  Procedure Laterality Date   APPENDECTOMY      Family History  Problem Relation Age of Onset   Hyperlipidemia Mother    Hypertension Father    Heart attack Paternal Grandmother    Alcohol abuse Paternal Grandfather     Social:  reports that he has never smoked. He has quit using smokeless tobacco.  His smokeless tobacco use included chew. He reports current alcohol use of about 16.0 standard drinks of alcohol per week. He reports that he does not use drugs.  Allergies: No Known Allergies  Medications: I have reviewed the patient's current medications.  Results for orders placed or performed during the hospital encounter of 09/27/22 (from the past 48 hour(s))  Urinalysis, Routine w reflex microscopic -Urine, Clean Catch     Status: Abnormal   Collection Time: 09/27/22  5:52 PM  Result Value Ref Range   Color, Urine YELLOW YELLOW   APPearance CLEAR CLEAR   Specific Gravity, Urine 1.025 1.005 - 1.030   pH 6.0 5.0 - 8.0   Glucose, UA NEGATIVE NEGATIVE mg/dL   Hgb urine dipstick SMALL (A) NEGATIVE   Bilirubin  Urine SMALL (A) NEGATIVE   Ketones, ur 15 (A) NEGATIVE mg/dL   Protein, ur 30 (A) NEGATIVE mg/dL   Nitrite NEGATIVE NEGATIVE   Leukocytes,Ua NEGATIVE NEGATIVE    Comment: Performed at [facility] [address], [place], [place] [address]  Urinalysis, Microscopic (reflex)     Status: Abnormal   Collection Time: 09/27/22  5:52 PM  Result Value Ref Range   RBC / HPF 0-5 0 - 5 RBC/hpf   WBC, UA 0-5 0 - 5 WBC/hpf   Bacteria, UA RARE (A) NONE SEEN   Squamous Epithelial / HPF NONE SEEN 0 - 5 /HPF    Comment: Performed at [facility], [place] [address], [place], [place] [address]  Lipase, blood     Status: None   Collection Time: 09/27/22  5:58 PM  Result Value Ref Range   Lipase 33 11 - 51 U/L    Comment: Performed at [facility] [address], [place], [place] [address]  Comprehensive metabolic panel     Status: Abnormal   Collection Time: 09/27/22  5:58 PM  Result Value Ref Range   Sodium 133 (L) 135 - 145 mmol/L   Potassium 3.7 3.5 - 5.1 mmol/L   Chloride 98 98 - 111 mmol/L  CO2 25 22 - 32 mmol/L   Glucose, Bld 130 (H) 70 - 99 mg/dL    Comment: Glucose reference range applies only to samples taken after fasting for at least 8 hours.   BUN 7 6 - 20 mg/dL   Creatinine, Ser 1.04 0.61 - 1.24 mg/dL   Calcium 9.3 8.9 - 10.3 mg/dL   Total Protein 7.7 6.5 - 8.1 g/dL   Albumin 3.8 3.5 - 5.0 g/dL   AST 20 15 - 41 U/L   ALT 34 0 - 44 U/L   Alkaline Phosphatase 80 38 - 126 U/L   Total Bilirubin 1.2 0.3 - 1.2 mg/dL   GFR, Estimated >60 >60 mL/min    Comment: (NOTE) Calculated using the CKD-EPI Creatinine Equation (2021)    Anion gap 10 5 - 15    Comment: Performed at [facility] [address] [place] [address], [place], [place] [address]  CBC     Status: Abnormal   Collection Time: 09/27/22  5:58 PM  Result Value Ref Range   WBC 15.8 (H) 4.0 - 10.5 K/uL   RBC 5.29 4.22 - 5.81 MIL/uL   Hemoglobin 16.8 13.0 - 17.0 g/dL   HCT 47.8 39.0 - 52.0 %   MCV 90.4 80.0 - 100.0 fL    MCH 31.8 26.0 - 34.0 pg   MCHC 35.1 30.0 - 36.0 g/dL   RDW 12.4 11.5 - 15.5 %   Platelets 297 150 - 400 K/uL   nRBC 0.0 0.0 - 0.2 %    Comment: Performed at [facility], [place] [address], [place], [place] [address]    CT ABDOMEN PELVIS W CONTRAST  Addendum Date: 09/27/2022   ADDENDUM REPORT: 09/27/2022 22:06 ADDENDUM: Findings were discussed with and acknowledged by Dr. Langston Masker. Electronically Signed   By: Sammie Bench M.D.   On: 09/27/2022 22:06   Result Date: 09/27/2022 CLINICAL DATA:  Diverticulitis EXAM: CT ABDOMEN AND PELVIS WITH CONTRAST TECHNIQUE: Multidetector CT imaging of the abdomen and pelvis was performed using the standard protocol following bolus administration of intravenous contrast. RADIATION DOSE REDUCTION: This exam was performed according to the departmental dose-optimization program which includes automated exposure control, adjustment of the mA and/or kV according to patient size and/or use of iterative reconstruction technique. CONTRAST:  100 mL OMNIPAQUE IOHEXOL 350 MG/ML SOLN COMPARISON:  08/20/2006 FINDINGS: Lower chest: No acute abnormality. Hepatobiliary: No focal liver abnormality is seen. No gallstones, gallbladder wall thickening, or biliary dilatation. Pancreas: Unremarkable. No pancreatic ductal dilatation or surrounding inflammatory changes. Spleen: Prominent, 14.5 cm with a homogeneous texture. Adrenals/Urinary Tract: Adrenal glands are unremarkable. Kidneys are normal, without renal calculi, focal lesion, or hydronephrosis. Bladder is unremarkable. Stomach/Bowel: No bowel dilatation to suggest obstruction. Appendix has been removed. There is diverticulosis and diverticulitis of the sigmoid with loculated Peridiverticular perforation and extraluminal air noted in the sigmoid mesentery. There is non loculated free air identified as well. No discrete drainable abscess collection identified. Vascular/Lymphatic: No significant vascular findings are present. No  enlarged abdominal or pelvic lymph nodes. Reproductive: Prostate is unremarkable. Other: No abdominal wall hernia or abnormality. No abdominopelvic ascites. Musculoskeletal: No acute or significant osseous findings. IMPRESSION: 1. Perforated sigmoid diverticulitis. 2. No discrete drainable abscess collection. 3. Mild splenomegaly. Electronically Signed: By: Sammie Bench M.D. On: 09/27/2022 21:57    ROS - all of the below systems have been reviewed with the patient and positives are indicated with bold text General: chills, fever or night sweats Eyes: blurry vision or double vision  ENT: epistaxis or sore throat Allergy/Immunology: itchy/watery eyes or nasal congestion Hematologic/Lymphatic: bleeding problems, blood clots or swollen lymph nodes Endocrine: temperature intolerance or unexpected weight changes Breast: new or changing breast lumps or nipple discharge Resp: cough, shortness of breath, or wheezing CV: chest pain or dyspnea on exertion GI: as per HPI GU: dysuria, trouble voiding, or hematuria MSK: joint pain or joint stiffness Neuro: TIA or stroke symptoms Derm: pruritus and skin lesion changes Psych: anxiety and depression  PE Blood pressure 134/76, pulse 96, temperature 99.6 F (37.6 C), temperature source Oral, resp. rate 20, height 6' 1"$  (1.854 m), weight 120.2 kg, SpO2 96 %. Constitutional: NAD; conversant Eyes: Moist conjunctiva Lungs: Normal respiratory effort CV: RRR GI: Abd soft, ttp in hypogastrium; no tenderness in upper abdomen; not significantly distended MSK: Normal range of motion of extremities Psychiatric: Appropriate affect  Results for orders placed or performed during the hospital encounter of 09/27/22 (from the past 48 hour(s))  Urinalysis, Routine w reflex microscopic -Urine, Clean Catch     Status: Abnormal   Collection Time: 09/27/22  5:52 PM  Result Value Ref Range   Color, Urine YELLOW YELLOW   APPearance CLEAR CLEAR   Specific Gravity, Urine  1.025 1.005 - 1.030   pH 6.0 5.0 - 8.0   Glucose, UA NEGATIVE NEGATIVE mg/dL   Hgb urine dipstick SMALL (A) NEGATIVE   Bilirubin Urine SMALL (A) NEGATIVE   Ketones, ur 15 (A) NEGATIVE mg/dL   Protein, ur 30 (A) NEGATIVE mg/dL   Nitrite NEGATIVE NEGATIVE   Leukocytes,Ua NEGATIVE NEGATIVE    Comment: Performed at [facility], [address] [place], [place] [address]  Urinalysis, Microscopic (reflex)     Status: Abnormal   Collection Time: 09/27/22  5:52 PM  Result Value Ref Range   RBC / HPF 0-5 0 - 5 RBC/hpf   WBC, UA 0-5 0 - 5 WBC/hpf   Bacteria, UA RARE (A) NONE SEEN   Squamous Epithelial / HPF NONE SEEN 0 - 5 /HPF    Comment: Performed at [facility], [place] [address] [place], [place] [address]  Lipase, blood     Status: None   Collection Time: 09/27/22  5:58 PM  Result Value Ref Range   Lipase 33 11 - 51 U/L    Comment: Performed at [facility] [address], [place], [place] [address]  Comprehensive metabolic panel     Status: Abnormal   Collection Time: 09/27/22  5:58 PM  Result Value Ref Range   Sodium 133 (L) 135 - 145 mmol/L   Potassium 3.7 3.5 - 5.1 mmol/L   Chloride 98 98 - 111 mmol/L   CO2 25 22 - 32 mmol/L   Glucose, Bld 130 (H) 70 - 99 mg/dL    Comment: Glucose reference range applies only to samples taken after fasting for at least 8 hours.   BUN 7 6 - 20 mg/dL   Creatinine, Ser 1.04 0.61 - 1.24 mg/dL   Calcium 9.3 8.9 - 10.3 mg/dL   Total Protein 7.7 6.5 - 8.1 g/dL   Albumin 3.8 3.5 - 5.0 g/dL   AST 20 15 - 41 U/L   ALT 34 0 - 44 U/L   Alkaline Phosphatase 80 38 - 126 U/L   Total Bilirubin 1.2 0.3 - 1.2 mg/dL   GFR, Estimated >60 >60 mL/min    Comment: (NOTE) Calculated using the CKD-EPI Creatinine Equation (2021)    Anion gap 10 5 - 15  Comment: Performed at [facility], [place] [address] [place], [place] [address]  CBC     Status: Abnormal   Collection Time: 09/27/22  5:58 PM  Result Value Ref Range   WBC 15.8 (H) 4.0 -  10.5 K/uL   RBC 5.29 4.22 - 5.81 MIL/uL   Hemoglobin 16.8 13.0 - 17.0 g/dL   HCT 47.8 39.0 - 52.0 %   MCV 90.4 80.0 - 100.0 fL   MCH 31.8 26.0 - 34.0 pg   MCHC 35.1 30.0 - 36.0 g/dL   RDW 12.4 11.5 - 15.5 %   Platelets 297 150 - 400 K/uL   nRBC 0.0 0.0 - 0.2 %    Comment: Performed at [facility], [place] [address] [place], [place] [address]    CT ABDOMEN PELVIS W CONTRAST  Addendum Date: 09/27/2022   ADDENDUM REPORT: 09/27/2022 22:06 ADDENDUM: Findings were discussed with and acknowledged by Dr. Langston Masker. Electronically Signed   By: Sammie Bench M.D.   On: 09/27/2022 22:06   Result Date: 09/27/2022 CLINICAL DATA:  Diverticulitis EXAM: CT ABDOMEN AND PELVIS WITH CONTRAST TECHNIQUE: Multidetector CT imaging of the abdomen and pelvis was performed using the standard protocol following bolus administration of intravenous contrast. RADIATION DOSE REDUCTION: This exam was performed according to the departmental dose-optimization program which includes automated exposure control, adjustment of the mA and/or kV according to patient size and/or use of iterative reconstruction technique. CONTRAST:  100 mL OMNIPAQUE IOHEXOL 350 MG/ML SOLN COMPARISON:  08/20/2006 FINDINGS: Lower chest: No acute abnormality. Hepatobiliary: No focal liver abnormality is seen. No gallstones, gallbladder wall thickening, or biliary dilatation. Pancreas: Unremarkable. No pancreatic ductal dilatation or surrounding inflammatory changes. Spleen: Prominent, 14.5 cm with a homogeneous texture. Adrenals/Urinary Tract: Adrenal glands are unremarkable. Kidneys are normal, without renal calculi, focal lesion, or hydronephrosis. Bladder is unremarkable. Stomach/Bowel: No bowel dilatation to suggest obstruction. Appendix has been removed. There is diverticulosis and diverticulitis of the sigmoid with loculated Peridiverticular perforation and extraluminal air noted in the sigmoid mesentery. There is non loculated free air identified  as well. No discrete drainable abscess collection identified. Vascular/Lymphatic: No significant vascular findings are present. No enlarged abdominal or pelvic lymph nodes. Reproductive: Prostate is unremarkable. Other: No abdominal wall hernia or abnormality. No abdominopelvic ascites. Musculoskeletal: No acute or significant osseous findings. IMPRESSION: 1. Perforated sigmoid diverticulitis. 2. No discrete drainable abscess collection. 3. Mild splenomegaly. Electronically Signed: By: Sammie Bench M.D. On: 09/27/2022 21:57    A/P: George D Doering is an 32 y.o. male with hx eczema - here with acute sigmoid diverticulitis   Appears to be relatively well contained perforation with associated contained air in mesentery; no discrete abscess at this time; no free fluid  -Admit to ward -IV Zosyn -NPO, MIVF  -We spent time reviewing the overall anatomy and physiology of the GI tract and pathophysiology diverticulitis.  We discussed the findings on his CT scan.  We discussed the plan as noted above for admission to the hospital, IV antibiotics, and close monitoring.  We discussed if he does not improve over the coming days, the potential for additional imaging versus surgery.  We discussed situations where people can develop abscesses and require things like percutaneous drain placement as well as clinical deterioration which could necessitate surgery and general expectations therein including colectomy with possible ostomy.  -He and his fiance's questions were answered to their satisfaction, they expressed understanding of all the above and agreement with the plan.  I spent a  total of 60 minutes in both face-to-face and non-face-to-face activities, excluding procedures performed, for this visit on the date of this encounter.  Nadeen Landau, [facility], [place]

## 2022-09-28 ENCOUNTER — Encounter (HOSPITAL_COMMUNITY): Payer: Self-pay

## 2022-09-28 LAB — BASIC METABOLIC PANEL
Anion gap: 11 (ref 5–15)
BUN: 10 mg/dL (ref 6–20)
CO2: 21 mmol/L — ABNORMAL LOW (ref 22–32)
Calcium: 8.7 mg/dL — ABNORMAL LOW (ref 8.9–10.3)
Chloride: 101 mmol/L (ref 98–111)
Creatinine, Ser: 1.01 mg/dL (ref 0.61–1.24)
GFR, Estimated: >60 mL/min (ref >60)
Glucose, Bld: 97 mg/dL (ref 70–99)
Potassium: 3.8 mmol/L (ref 3.5–5.1)
Sodium: 133 mmol/L — ABNORMAL LOW (ref 135–145)

## 2022-09-28 LAB — CBC
HCT: 44.2 % (ref 39.0–52.0)
Hemoglobin: 15.5 g/dL (ref 13.0–17.0)
MCH: 31.4 pg (ref 26.0–34.0)
MCHC: 35.1 g/dL (ref 30.0–36.0)
MCV: 89.5 fL (ref 80.0–100.0)
Platelets: 260 10*3/uL (ref 150–400)
RBC: 4.94 MIL/uL (ref 4.22–5.81)
RDW: 12.3 % (ref 11.5–15.5)
WBC: 14.6 10*3/uL — ABNORMAL HIGH (ref 4.0–10.5)
nRBC: 0 % (ref 0.0–0.2)

## 2022-09-28 LAB — HIV ANTIBODY (ROUTINE TESTING W REFLEX): HIV Screen 4th Generation wRfx: NONREACTIVE

## 2022-09-28 MED ORDER — PIPERACILLIN-TAZOBACTAM 4.5 G IVPB: 4.5000 g | Freq: Four times a day (QID) | INTRAVENOUS | Status: DC

## 2022-09-28 MED ORDER — PIPERACILLIN-TAZOBACTAM 3.375 G IVPB
3.3750 g | Freq: Three times a day (TID) | INTRAVENOUS | Status: DC
  Administered 2022-09-28 – 2022-10-08 (×31): 3.375 g via INTRAVENOUS
  Filled 2022-09-28 (×29): qty 50

## 2022-09-28 NOTE — Plan of Care (Signed)

## 2022-09-28 NOTE — Progress Notes (Signed)
Pharmacy Antibiotic Note  George Rodriguez is a 32 y.o. male admitted on 09/27/2022 with  intra-abdominal infection .  Pharmacy has been consulted for Zosyn dosing. Patient presented with abdominal pain with no bowel movement in 2 days and found to have acute sigmoid diverticulitis with relatively contained perforation and no discrete abscess seen. WBC 15.8, Cr 1.04 (bl~unknown), afebrile  Plan: Zosyn 3.375g IV q8h (4 hour infusion). Continue to monitor for clinical improvement, fever curve, WBC, culture data Monitor renal function   Height: 6' 1"$  (185.4 cm) Weight: 120.2 kg (265 lb) IBW/kg (Calculated) : 79.9  Temp (24hrs), Avg:99.5 F (37.5 C), Min:99.3 F (37.4 C), Max:99.6 F (37.6 C)  Recent Labs  Lab 09/27/22 1758  WBC 15.8*  CREATININE 1.04    Estimated Creatinine Clearance: 141 mL/min (by C-G formula based on SCr of 1.04 mg/dL).    No Known Allergies  Antimicrobials this admission: Zosyn 2/20 >>   Microbiology results: N/A  Thank you for allowing pharmacy to be a part of this patient's care.  Sandford Craze, PharmD. [facility] Acute Care PGY-1  09/28/2022 12:08 AM

## 2022-09-28 NOTE — TOC CM/SW Note (Signed)
  Transition of Care [facility]) Screening Note   Patient Details  Name: George Rodriguez Date of Birth: [birth year]   Transition of Care Department [facility]) has reviewed patient and no TOC needs have been identified at this time. We will continue to monitor patient advancement through interdisciplinary progression rounds. If new patient transition needs arise, please place a TOC consult.

## 2022-09-28 NOTE — Progress Notes (Signed)
Progress Note     Subjective: Still having abdominal pain but improved from yesterday. No BM. No n/v. Uncomfortable from fever overnight. Thirsty  Fiance at bedside.   Objective: Vital signs in last 24 hours: Temp:  [98.3 F (36.8 C)-100.1 F (37.8 C)] 98.3 F (36.8 C) (02/20 0825) Pulse Rate:  [88-111] 90 (02/20 0825) Resp:  [18-20] 18 (02/20 0825) BP: (120-151)/(66-91) 126/84 (02/20 0825) SpO2:  [95 %-98 %] 96 % (02/20 0825) Weight:  [120.2 kg] 120.2 kg (02/19 1700) Last BM Date : 09/27/22  Intake/Output from previous day: 02/19 0701 - 02/20 0700 In: 76.3 [IV Piggyback:76.3] Out: -  Intake/Output this shift: No intake/output data recorded.  PE: General: pleasant, WD, male who is laying in bed in NAD Lungs: Respiratory effort nonlabored Abd: soft, ND, +BS, mild TTP across lower abdomen with voluntary guarding MSK: all 4 extremities are symmetrical with no cyanosis, clubbing, or edema. Skin: warm and dry with no masses, lesions, or rashes Neuro: Cranial nerves 2-12 grossly intact, sensation is normal throughout Psych: A&Ox3 with an appropriate affect.    Lab Results:  Recent Labs    09/27/22 1758 09/28/22 0121  WBC 15.8* 14.6*  HGB 16.8 15.5  HCT 47.8 44.2  PLT 297 260   BMET Recent Labs    09/27/22 1758 09/28/22 0121  NA 133* 133*  K 3.7 3.8  CL 98 101  CO2 25 21*  GLUCOSE 130* 97  BUN 7 10  CREATININE 1.04 1.01  CALCIUM 9.3 8.7*   PT/INR No results for input(s): "LABPROT", "INR" in the last 72 hours. CMP     Component Value Date/Time   NA 133 (L) 09/28/2022 0121   K 3.8 09/28/2022 0121   CL 101 09/28/2022 0121   CO2 21 (L) 09/28/2022 0121   GLUCOSE 97 09/28/2022 0121   BUN 10 09/28/2022 0121   CREATININE 1.01 09/28/2022 0121   CALCIUM 8.7 (L) 09/28/2022 0121   PROT 7.7 09/27/2022 1758   ALBUMIN 3.8 09/27/2022 1758   AST 20 09/27/2022 1758   ALT 34 09/27/2022 1758   ALKPHOS 80 09/27/2022 1758   BILITOT 1.2 09/27/2022 1758    GFRNONAA >60 09/28/2022 0121   Lipase     Component Value Date/Time   LIPASE 33 09/27/2022 1758       Studies/Results: CT ABDOMEN PELVIS W CONTRAST  Addendum Date: 09/27/2022   ADDENDUM REPORT: 09/27/2022 22:06 ADDENDUM: Findings were discussed with and acknowledged by Dr. Langston Masker. Electronically Signed   By: Sammie Bench M.D.   On: 09/27/2022 22:06   Result Date: 09/27/2022 CLINICAL DATA:  Diverticulitis EXAM: CT ABDOMEN AND PELVIS WITH CONTRAST TECHNIQUE: Multidetector CT imaging of the abdomen and pelvis was performed using the standard protocol following bolus administration of intravenous contrast. RADIATION DOSE REDUCTION: This exam was performed according to the departmental dose-optimization program which includes automated exposure control, adjustment of the mA and/or kV according to patient size and/or use of iterative reconstruction technique. CONTRAST:  100 mL OMNIPAQUE IOHEXOL 350 MG/ML SOLN COMPARISON:  08/20/2006 FINDINGS: Lower chest: No acute abnormality. Hepatobiliary: No focal liver abnormality is seen. No gallstones, gallbladder wall thickening, or biliary dilatation. Pancreas: Unremarkable. No pancreatic ductal dilatation or surrounding inflammatory changes. Spleen: Prominent, 14.5 cm with a homogeneous texture. Adrenals/Urinary Tract: Adrenal glands are unremarkable. Kidneys are normal, without renal calculi, focal lesion, or hydronephrosis. Bladder is unremarkable. Stomach/Bowel: No bowel dilatation to suggest obstruction. Appendix has been removed. There is diverticulosis and diverticulitis of the sigmoid with loculated Peridiverticular  perforation and extraluminal air noted in the sigmoid mesentery. There is non loculated free air identified as well. No discrete drainable abscess collection identified. Vascular/Lymphatic: No significant vascular findings are present. No enlarged abdominal or pelvic lymph nodes. Reproductive: Prostate is unremarkable. Other: No abdominal  wall hernia or abnormality. No abdominopelvic ascites. Musculoskeletal: No acute or significant osseous findings. IMPRESSION: 1. Perforated sigmoid diverticulitis. 2. No discrete drainable abscess collection. 3. Mild splenomegaly. Electronically Signed: By: Sammie Bench M.D. On: 09/27/2022 21:57    Anti-infectives: Anti-infectives (From admission, onward)    Start     Dose/Rate Route Frequency Ordered Stop   09/28/22 1000  terbinafine (LAMISIL) tablet 250 mg       Note to Pharmacy: Take with food     250 mg Oral Daily 09/27/22 2356     09/28/22 0600  piperacillin-tazobactam (ZOSYN) IVPB 3.375 g  Status:  Discontinued        3.375 g 12.5 mL/hr over 240 Minutes Intravenous Every 8 hours 09/27/22 2244 09/27/22 2356   09/28/22 0015  piperacillin-tazobactam (ZOSYN) IVPB 4.5 g  Status:  Discontinued        4.5 g 200 mL/hr over 30 Minutes Intravenous Every 6 hours 09/28/22 0011 09/28/22 0012   09/28/22 0015  piperacillin-tazobactam (ZOSYN) IVPB 3.375 g        3.375 g 12.5 mL/hr over 240 Minutes Intravenous Every 8 hours 09/28/22 0012     09/27/22 2215  metroNIDAZOLE (FLAGYL) IVPB 500 mg  Status:  Discontinued        500 mg 100 mL/hr over 60 Minutes Intravenous Every 8 hours 09/27/22 2206 09/27/22 2234   09/27/22 2215  ciprofloxacin (CIPRO) IVPB 400 mg  Status:  Discontinued        400 mg 200 mL/hr over 60 Minutes Intravenous Every 12 hours 09/27/22 2206 09/27/22 2244        Assessment/Plan Sigmoid diverticulitis with contained perforation without abscess - continue IV abx and bowel rest today - may have sips/chips - WBC still elevated but improved to 14.6. tmax 100.30F overnight. - No current indication for emergency surgery - Hopefully patient will improve with conservative treatment.  Discussed if he was to fail to improve may obtain follow up CT scan to rule out abscess.  - If patient improves with conservative therapies would recommend colonoscopy in ~6 weeks.    FEN: NPO  sips/chips, LR @ 12m/hr ID: [email or web address] VTE: lovenox  Eczema dermatitis Asthma  I reviewed ED provider notes, last 24 h vitals and pain scores, last 48 h intake and output, last 24 h labs and trends, and last 24 h imaging results.    LOS: 1 day   [facility] 09/28/2022, 10:16 AM Please see Amion for pager number during day hours 7:00am-4:30pm

## 2022-09-28 NOTE — Progress Notes (Incomplete)
Pharmacy Antibiotic Note  George Rodriguez is a 32 y.o. male admitted on 09/27/2022 with {Indications:3041527}.  Pharmacy has been consulted for *** dosing.  Plan: {Assessment:21075}  Height: 6' 1"$  (185.4 cm) Weight: 120.2 kg (265 lb) IBW/kg (Calculated) : 79.9  Temp (24hrs), Avg:99.5 F (37.5 C), Min:99.3 F (37.4 C), Max:99.6 F (37.6 C)  Recent Labs  Lab 09/27/22 1758  WBC 15.8*  CREATININE 1.04    Estimated Creatinine Clearance: 141 mL/min (by C-G formula based on SCr of 1.04 mg/dL).    No Known Allergies  Antimicrobials this admission: *** *** >> *** *** *** >> ***  Dose adjustments this admission: ***  Microbiology results: *** BCx: *** *** UCx: ***  *** Sputum: ***  *** MRSA PCR: ***  Thank you for allowing pharmacy to be a part of this patient's care.  Jeneen Rinks [facility] 09/28/2022 12:03 AM

## 2022-09-28 NOTE — Plan of Care (Signed)
  Problem: Education: Goal: Knowledge of General Education information will improve Description: Including pain rating scale, medication(s)/side effects and non-pharmacologic comfort measures 09/28/2022 2023 by Particia Lather, RN Outcome: Progressing 09/28/2022 2020 by Particia Lather, RN Outcome: Progressing   Problem: Health Behavior/Discharge Planning: Goal: Ability to manage health-related needs will improve 09/28/2022 2023 by Particia Lather, RN Outcome: Progressing 09/28/2022 2020 by Particia Lather, RN Outcome: Progressing   Problem: Clinical Measurements: Goal: Ability to maintain clinical measurements within normal limits will improve 09/28/2022 2023 by Particia Lather, RN Outcome: Progressing 09/28/2022 2020 by Particia Lather, RN Outcome: Progressing Goal: Will remain free from infection 09/28/2022 2023 by Particia Lather, RN Outcome: Progressing 09/28/2022 2020 by Particia Lather, RN Outcome: Progressing Goal: Diagnostic test results will improve 09/28/2022 2023 by Particia Lather, RN Outcome: Progressing 09/28/2022 2020 by Particia Lather, RN Outcome: Progressing Goal: Respiratory complications will improve 09/28/2022 2023 by Particia Lather, RN Outcome: Progressing 09/28/2022 2020 by Particia Lather, RN Outcome: Progressing Goal: Cardiovascular complication will be avoided 09/28/2022 2023 by Particia Lather, RN Outcome: Progressing 09/28/2022 2020 by Particia Lather, RN Outcome: Progressing   Problem: Activity: Goal: Risk for activity intolerance will decrease 09/28/2022 2023 by Particia Lather, RN Outcome: Progressing 09/28/2022 2020 by Particia Lather, RN Outcome: Progressing   Problem: Nutrition: Goal: Adequate nutrition will be maintained 09/28/2022 2023 by Particia Lather, RN Outcome: Progressing 09/28/2022 2020 by Particia Lather, RN Outcome: Progressing   Problem: Coping: Goal: Level of anxiety will decrease 09/28/2022  2023 by Particia Lather, RN Outcome: Progressing 09/28/2022 2020 by Particia Lather, RN Outcome: Progressing   Problem: Elimination: Goal: Will not experience complications related to bowel motility 09/28/2022 2023 by Particia Lather, RN Outcome: Progressing 09/28/2022 2020 by Particia Lather, RN Outcome: Progressing Goal: Will not experience complications related to urinary retention 09/28/2022 2023 by Particia Lather, RN Outcome: Progressing 09/28/2022 2020 by Particia Lather, RN Outcome: Progressing   Problem: Pain Managment: Goal: General experience of comfort will improve 09/28/2022 2023 by Particia Lather, RN Outcome: Progressing 09/28/2022 2020 by Particia Lather, RN Outcome: Progressing   Problem: Safety: Goal: Ability to remain free from injury will improve 09/28/2022 2023 by Particia Lather, RN Outcome: Progressing 09/28/2022 2020 by Particia Lather, RN Outcome: Progressing   Problem: Skin Integrity: Goal: Risk for impaired skin integrity will decrease 09/28/2022 2023 by Particia Lather, RN Outcome: Progressing 09/28/2022 2020 by Particia Lather, RN Outcome: Progressing

## 2022-09-28 NOTE — ED Notes (Signed)
ED TO INPATIENT HANDOFF REPORT  ED Nurse Name and Phone #: Anahi Belmar [phone]  S Name/Age/Gender George Rodriguez 32 y.o. male Room/Bed: 002C/002C  Code Status   Code Status: Full Code  Home/SNF/Other Home Patient oriented to: self, place, time, and situation Is this baseline? Yes   Triage Complete: Triage complete  Chief Complaint Diverticulitis [K57.92]  Triage Note Pt reports abd pain since Saturday morning but worsened yesterday associated with hematuria, fever, chills, nausea, and constipation. LBM today at 10am   Allergies No Known Allergies  Level of Care/Admitting Diagnosis ED Disposition     ED Disposition  Admit   Condition  --   [place]: [place] [[id number]]  Level of Care: Med-Surg [16]  May admit patient to [facility] or [facility] if equivalent level of care is available:: No  Covid Evaluation: Asymptomatic - no recent exposure (last 10 days) testing not required  Diagnosis: Diverticulitis [id number]  Admitting Physician: Britton, [place]  Attending Physician: CCS, [place]  Bed request comments: 6n  Certification:: I certify this patient will need inpatient services for at least 2 midnights  Estimated Length of Stay: 4          B Medical/Surgery History Past Medical History:  Diagnosis Date   Anxiety    Asthma    Dermatitis    Eczema    Past Surgical History:  Procedure Laterality Date   APPENDECTOMY       A IV Location/Drains/Wounds Patient Lines/Drains/Airways Status     Active Line/Drains/Airways     Name Placement date Placement time Site Days   Peripheral IV 09/27/22 20 G Distal;Posterior;Right Forearm 09/27/22  1828  Forearm  1            Intake/Output Last 24 hours  Intake/Output Summary (Last 24 hours) at 09/28/2022 0024 Last data filed at 09/27/2022 2247 Gross per 24 hour  Intake 76.3 ml  Output --  Net 76.3 ml    Labs/Imaging Results for orders placed or performed during the  hospital encounter of 09/27/22 (from the past 48 hour(s))  Urinalysis, Routine w reflex microscopic -Urine, Clean Catch     Status: Abnormal   Collection Time: 09/27/22  5:52 PM  Result Value Ref Range   Color, Urine YELLOW YELLOW   APPearance CLEAR CLEAR   Specific Gravity, Urine 1.025 1.005 - 1.030   pH 6.0 5.0 - 8.0   Glucose, UA NEGATIVE NEGATIVE mg/dL   Hgb urine dipstick SMALL (A) NEGATIVE   Bilirubin Urine SMALL (A) NEGATIVE   Ketones, ur 15 (A) NEGATIVE mg/dL   Protein, ur 30 (A) NEGATIVE mg/dL   Nitrite NEGATIVE NEGATIVE   Leukocytes,Ua NEGATIVE NEGATIVE    Comment: Performed at [facility], [place] [address], [place], [place] [address]  Urinalysis, Microscopic (reflex)     Status: Abnormal   Collection Time: 09/27/22  5:52 PM  Result Value Ref Range   RBC / HPF 0-5 0 - 5 RBC/hpf   WBC, UA 0-5 0 - 5 WBC/hpf   Bacteria, UA RARE (A) NONE SEEN   Squamous Epithelial / HPF NONE SEEN 0 - 5 /HPF    Comment: Performed at [facility], [place] [address], [place], [place] [address]  Lipase, blood     Status: None   Collection Time: 09/27/22  5:58 PM  Result Value Ref Range   Lipase 33 11 - 51 U/L    Comment: Performed at [facility], [address] [place], [place] [address]  Comprehensive metabolic panel     Status: Abnormal   Collection Time: 09/27/22  5:58 PM  Result Value Ref Range   Sodium 133 (L) 135 - 145 mmol/L   Potassium 3.7 3.5 - 5.1 mmol/L   Chloride 98 98 - 111 mmol/L   CO2 25 22 - 32 mmol/L   Glucose, Bld 130 (H) 70 - 99 mg/dL    Comment: Glucose reference range applies only to samples taken after fasting for at least 8 hours.   BUN 7 6 - 20 mg/dL   Creatinine, Ser 1.04 0.61 - 1.24 mg/dL   Calcium 9.3 8.9 - 10.3 mg/dL   Total Protein 7.7 6.5 - 8.1 g/dL   Albumin 3.8 3.5 - 5.0 g/dL   AST 20 15 - 41 U/L   ALT 34 0 - 44 U/L   Alkaline Phosphatase 80 38 - 126 U/L   Total Bilirubin 1.2 0.3 - 1.2 mg/dL   GFR, Estimated >60 >60 mL/min    Comment:  (NOTE) Calculated using the CKD-EPI Creatinine Equation (2021)    Anion gap 10 5 - 15    Comment: Performed at [place] [address], [place], [place] [address]  CBC     Status: Abnormal   Collection Time: 09/27/22  5:58 PM  Result Value Ref Range   WBC 15.8 (H) 4.0 - 10.5 K/uL   RBC 5.29 4.22 - 5.81 MIL/uL   Hemoglobin 16.8 13.0 - 17.0 g/dL   HCT 47.8 39.0 - 52.0 %   MCV 90.4 80.0 - 100.0 fL   MCH 31.8 26.0 - 34.0 pg   MCHC 35.1 30.0 - 36.0 g/dL   RDW 12.4 11.5 - 15.5 %   Platelets 297 150 - 400 K/uL   nRBC 0.0 0.0 - 0.2 %    Comment: Performed at [facility], [place] [address], [place], [place] [address]   CT ABDOMEN PELVIS W CONTRAST  Addendum Date: 09/27/2022   ADDENDUM REPORT: 09/27/2022 22:06 ADDENDUM: Findings were discussed with and acknowledged by Dr. Langston Masker. Electronically Signed   By: Sammie Bench M.D.   On: 09/27/2022 22:06   Result Date: 09/27/2022 CLINICAL DATA:  Diverticulitis EXAM: CT ABDOMEN AND PELVIS WITH CONTRAST TECHNIQUE: Multidetector CT imaging of the abdomen and pelvis was performed using the standard protocol following bolus administration of intravenous contrast. RADIATION DOSE REDUCTION: This exam was performed according to the departmental dose-optimization program which includes automated exposure control, adjustment of the mA and/or kV according to patient size and/or use of iterative reconstruction technique. CONTRAST:  100 mL OMNIPAQUE IOHEXOL 350 MG/ML SOLN COMPARISON:  08/20/2006 FINDINGS: Lower chest: No acute abnormality. Hepatobiliary: No focal liver abnormality is seen. No gallstones, gallbladder wall thickening, or biliary dilatation. Pancreas: Unremarkable. No pancreatic ductal dilatation or surrounding inflammatory changes. Spleen: Prominent, 14.5 cm with a homogeneous texture. Adrenals/Urinary Tract: Adrenal glands are unremarkable. Kidneys are normal, without renal calculi, focal lesion, or hydronephrosis. Bladder is  unremarkable. Stomach/Bowel: No bowel dilatation to suggest obstruction. Appendix has been removed. There is diverticulosis and diverticulitis of the sigmoid with loculated Peridiverticular perforation and extraluminal air noted in the sigmoid mesentery. There is non loculated free air identified as well. No discrete drainable abscess collection identified. Vascular/Lymphatic: No significant vascular findings are present. No enlarged abdominal or pelvic lymph nodes. Reproductive: Prostate is unremarkable. Other: No abdominal wall hernia or abnormality. No abdominopelvic ascites. Musculoskeletal: No acute or significant osseous findings. IMPRESSION: 1. Perforated sigmoid  diverticulitis. 2. No discrete drainable abscess collection. 3. Mild splenomegaly. Electronically Signed: By: Sammie Bench M.D. On: 09/27/2022 21:57    Pending Labs Unresulted Labs (From admission, onward)     Start     Ordered   10/04/22 0500  Creatinine, serum  (enoxaparin (LOVENOX)    CrCl >/= 30 ml/min)  Weekly,   R     Comments: while on enoxaparin therapy    09/27/22 2356   09/28/22 [id number]  Basic metabolic panel  Tomorrow morning,   R        09/27/22 2356   09/28/22 0500  CBC  Tomorrow morning,   R        09/27/22 2356   09/27/22 2357  HIV Antibody (routine testing w rflx)  (HIV Antibody (Routine testing w reflex) panel)  Once,   R        09/27/22 2356            Vitals/Pain Today's Vitals   09/27/22 2154 09/27/22 2230 09/27/22 2249 09/27/22 2314  BP:  (!) 140/79    Pulse:  98    Resp:  18    Temp: 99.6 F (37.6 C)     TempSrc: Oral     SpO2:  97%    Weight:      Height:      PainSc:   4  4     Isolation Precautions No active isolations  Medications Medications  terbinafine (LAMISIL) tablet 250 mg (has no administration in time range)  albuterol (PROVENTIL) (2.5 MG/3ML) 0.083% nebulizer solution 3 mL (has no administration in time range)  fluticasone furoate-vilanterol (BREO ELLIPTA) 100-25 MCG/ACT  1 puff (has no administration in time range)  fluticasone (FLONASE) 50 MCG/ACT nasal spray 2 spray (has no administration in time range)  triamcinolone (NASACORT) nasal inhaler 2 spray (has no administration in time range)  clobetasol cream (TEMOVATE) [id number] % 1 Application (has no administration in time range)  enoxaparin (LOVENOX) injection 40 mg (has no administration in time range)  lactated ringers infusion (has no administration in time range)  acetaminophen (TYLENOL) tablet 1,000 mg (1,000 mg Oral Given 09/28/22 0020)  ibuprofen (ADVIL) tablet 600 mg (has no administration in time range)  traMADol (ULTRAM) tablet 50 mg (has no administration in time range)  oxyCODONE (Oxy IR/ROXICODONE) immediate release tablet 5-10 mg (has no administration in time range)  HYDROmorphone (DILAUDID) injection 0.5 mg (has no administration in time range)  diphenhydrAMINE (BENADRYL) 12.5 MG/5ML elixir 12.5 mg (has no administration in time range)    Or  diphenhydrAMINE (BENADRYL) injection 12.5 mg (has no administration in time range)  ondansetron (ZOFRAN-ODT) disintegrating tablet 4 mg (has no administration in time range)    Or  ondansetron (ZOFRAN) injection 4 mg (has no administration in time range)  simethicone (MYLICON) chewable tablet 40 mg (has no administration in time range)  hydrALAZINE (APRESOLINE) injection 10 mg (has no administration in time range)  piperacillin-tazobactam (ZOSYN) IVPB 3.375 g (3.375 g Intravenous New Bag/Given 09/28/22 0022)  ketorolac (TORADOL) 30 MG/ML injection 30 mg (30 mg Intravenous Given 09/27/22 1840)  iohexol (OMNIPAQUE) 350 MG/ML injection 100 mL (100 mLs Intravenous Contrast Given 09/27/22 2136)  morphine (PF) 4 MG/ML injection 4 mg (4 mg Intravenous Given 09/27/22 2220)  ondansetron (ZOFRAN) injection 4 mg (4 mg Intravenous Given 09/27/22 2218)    Mobility walks     Focused Assessments GI   R Recommendations: See Admitting Provider Note  Report given to:    Additional Notes:  N/A

## 2022-09-29 LAB — CBC
HCT: 44.1 % (ref 39.0–52.0)
Hemoglobin: 14.7 g/dL (ref 13.0–17.0)
MCH: 30.4 pg (ref 26.0–34.0)
MCHC: 33.3 g/dL (ref 30.0–36.0)
MCV: 91.1 fL (ref 80.0–100.0)
Platelets: 276 10*3/uL (ref 150–400)
RBC: 4.84 MIL/uL (ref 4.22–5.81)
RDW: 12 % (ref 11.5–15.5)
WBC: 10.2 10*3/uL (ref 4.0–10.5)
nRBC: 0 % (ref 0.0–0.2)

## 2022-09-29 LAB — BASIC METABOLIC PANEL
Anion gap: 13 (ref 5–15)
BUN: 10 mg/dL (ref 6–20)
CO2: 19 mmol/L — ABNORMAL LOW (ref 22–32)
Calcium: 9.2 mg/dL (ref 8.9–10.3)
Chloride: 104 mmol/L (ref 98–111)
Creatinine, Ser: 0.93 mg/dL (ref 0.61–1.24)
GFR, Estimated: >60 mL/min (ref >60)
Glucose, Bld: 93 mg/dL (ref 70–99)
Potassium: 3.6 mmol/L (ref 3.5–5.1)
Sodium: 136 mmol/L (ref 135–145)

## 2022-09-29 MED ORDER — BOOST / RESOURCE BREEZE PO LIQD CUSTOM
1.0000 | Freq: Two times a day (BID) | ORAL | Status: DC
  Administered 2022-09-29 – 2022-09-30 (×2): 1 via ORAL

## 2022-09-29 NOTE — Progress Notes (Signed)
   09/29/22 1000  Mobility  Activity Ambulated with assistance in hallway  Level of Assistance Modified independent, requires aide device or extra time  Assistive Device  (IV Pole)  Distance Ambulated (ft) 550 ft  Activity Response Tolerated well  Mobility Referral Yes  $Mobility charge 1 Mobility   Mobility Specialist Progress Note  Pt was in bed and agreeable. Had no c/o pain. Returned to bed w/ all needs met and call bell in reach  [place] Specialist  Please contact via Solicitor or Rehab office at [phone]

## 2022-09-29 NOTE — Progress Notes (Signed)
[facility] Progress Note     Subjective: CC-  Feeling much better today. Reports pain as 3/10 prior to pain medication. Pain is mostly in his lower abdomen. Denies n/v. Feels hungry. Passing flatus, no BM. Labs pending. TMAX 99.6  Objective: Vital signs in last 24 hours: Temp:  [98.1 F (36.7 C)-99.6 F (37.6 C)] 98.1 F (36.7 C) (02/21 0734) Pulse Rate:  [87-101] 101 (02/21 0734) Resp:  [16-20] 16 (02/21 0734) BP: (128-141)/(67-94) 138/94 (02/21 0734) SpO2:  [95 %-98 %] 96 % (02/21 0734) Last BM Date : 09/28/22  Intake/Output from previous day: 02/20 0701 - 02/21 0700 In: 1152.9 [I.V.:1000; IV Piggyback:152.9] Out: -  Intake/Output this shift: No intake/output data recorded.  PE: Gen:  Alert, NAD, pleasant Pulm:  rate and effort normal on room air Abd: soft, ND, minimal TTP across the lower abdomen without rebound or guarding  Lab Results:  Recent Labs    09/27/22 1758 09/28/22 0121  WBC 15.8* 14.6*  HGB 16.8 15.5  HCT 47.8 44.2  PLT 297 260   BMET Recent Labs    09/27/22 1758 09/28/22 0121  NA 133* 133*  K 3.7 3.8  CL 98 101  CO2 25 21*  GLUCOSE 130* 97  BUN 7 10  CREATININE 1.04 1.01  CALCIUM 9.3 8.7*   PT/INR No results for input(s): "LABPROT", "INR" in the last 72 hours. CMP     Component Value Date/Time   NA 133 (L) 09/28/2022 0121   K 3.8 09/28/2022 0121   CL 101 09/28/2022 0121   CO2 21 (L) 09/28/2022 0121   GLUCOSE 97 09/28/2022 0121   BUN 10 09/28/2022 0121   CREATININE 1.01 09/28/2022 0121   CALCIUM 8.7 (L) 09/28/2022 0121   PROT 7.7 09/27/2022 1758   ALBUMIN 3.8 09/27/2022 1758   AST 20 09/27/2022 1758   ALT 34 09/27/2022 1758   ALKPHOS 80 09/27/2022 1758   BILITOT 1.2 09/27/2022 1758   GFRNONAA >60 09/28/2022 0121   Lipase     Component Value Date/Time   LIPASE 33 09/27/2022 1758       Studies/Results: CT ABDOMEN PELVIS W CONTRAST  Addendum Date: 09/27/2022   ADDENDUM REPORT: 09/27/2022 22:06  ADDENDUM: Findings were discussed with and acknowledged by Dr. Langston Masker. Electronically Signed   By: Sammie Bench M.D.   On: 09/27/2022 22:06   Result Date: 09/27/2022 CLINICAL DATA:  Diverticulitis EXAM: CT ABDOMEN AND PELVIS WITH CONTRAST TECHNIQUE: Multidetector CT imaging of the abdomen and pelvis was performed using the standard protocol following bolus administration of intravenous contrast. RADIATION DOSE REDUCTION: This exam was performed according to the departmental dose-optimization program which includes automated exposure control, adjustment of the mA and/or kV according to patient size and/or use of iterative reconstruction technique. CONTRAST:  100 mL OMNIPAQUE IOHEXOL 350 MG/ML SOLN COMPARISON:  08/20/2006 FINDINGS: Lower chest: No acute abnormality. Hepatobiliary: No focal liver abnormality is seen. No gallstones, gallbladder wall thickening, or biliary dilatation. Pancreas: Unremarkable. No pancreatic ductal dilatation or surrounding inflammatory changes. Spleen: Prominent, 14.5 cm with a homogeneous texture. Adrenals/Urinary Tract: Adrenal glands are unremarkable. Kidneys are normal, without renal calculi, focal lesion, or hydronephrosis. Bladder is unremarkable. Stomach/Bowel: No bowel dilatation to suggest obstruction. Appendix has been removed. There is diverticulosis and diverticulitis of the sigmoid with loculated Peridiverticular perforation and extraluminal air noted in the sigmoid mesentery. There is non loculated free air identified as well. No discrete drainable abscess collection identified. Vascular/Lymphatic: No significant vascular findings are present. No enlarged abdominal  or pelvic lymph nodes. Reproductive: Prostate is unremarkable. Other: No abdominal wall hernia or abnormality. No abdominopelvic ascites. Musculoskeletal: No acute or significant osseous findings. IMPRESSION: 1. Perforated sigmoid diverticulitis. 2. No discrete drainable abscess collection. 3. Mild  splenomegaly. Electronically Signed: By: Sammie Bench M.D. On: 09/27/2022 21:57    Anti-infectives: Anti-infectives (From admission, onward)    Start     Dose/Rate Route Frequency Ordered Stop   09/28/22 1000  terbinafine (LAMISIL) tablet 250 mg       Note to Pharmacy: Take with food     250 mg Oral Daily 09/27/22 2356     09/28/22 0600  piperacillin-tazobactam (ZOSYN) IVPB 3.375 g  Status:  Discontinued        3.375 g 12.5 mL/hr over 240 Minutes Intravenous Every 8 hours 09/27/22 2244 09/27/22 2356   09/28/22 0015  piperacillin-tazobactam (ZOSYN) IVPB 4.5 g  Status:  Discontinued        4.5 g 200 mL/hr over 30 Minutes Intravenous Every 6 hours 09/28/22 0011 09/28/22 0012   09/28/22 0015  piperacillin-tazobactam (ZOSYN) IVPB 3.375 g        3.375 g 12.5 mL/hr over 240 Minutes Intravenous Every 8 hours 09/28/22 0012     09/27/22 2215  metroNIDAZOLE (FLAGYL) IVPB 500 mg  Status:  Discontinued        500 mg 100 mL/hr over 60 Minutes Intravenous Every 8 hours 09/27/22 2206 09/27/22 2234   09/27/22 2215  ciprofloxacin (CIPRO) IVPB 400 mg  Status:  Discontinued        400 mg 200 mL/hr over 60 Minutes Intravenous Every 12 hours 09/27/22 2206 09/27/22 2244        Assessment/Plan Sigmoid diverticulitis with contained perforation without abscess - Labs pending this morning but pain significantly improved and pt afebrile. Ok for clear liquids. Continue IV antibiotics - No current indication for emergency surgery - Hopefully patient will improve with conservative treatment.  Discussed if he was to fail to improve may obtain follow up CT scan to rule out abscess.  - If patient improves with conservative therapies would recommend colonoscopy in ~6 weeks.     FEN: CLD, LR @ 189m/hr ID: [email or web address] VTE: [email or web address]   Eczema dermatitis Asthma  I reviewed last 24 h vitals and pain scores, last 48 h intake and output, and last 24 h labs and trends.    LOS: 2 days    BFort [facility] 09/29/2022, 11:18 AM Please see Amion for pager number during day hours 7:00am-4:30pm

## 2022-09-29 NOTE — Plan of Care (Signed)

## 2022-09-30 LAB — CBC
HCT: 41.7 % (ref 39.0–52.0)
Hemoglobin: 14.6 g/dL (ref 13.0–17.0)
MCH: 31.3 pg (ref 26.0–34.0)
MCHC: 35 g/dL (ref 30.0–36.0)
MCV: 89.3 fL (ref 80.0–100.0)
Platelets: 281 10*3/uL (ref 150–400)
RBC: 4.67 MIL/uL (ref 4.22–5.81)
RDW: 12 % (ref 11.5–15.5)
WBC: 7.5 10*3/uL (ref 4.0–10.5)
nRBC: 0 % (ref 0.0–0.2)

## 2022-09-30 LAB — BASIC METABOLIC PANEL
Anion gap: 13 (ref 5–15)
BUN: 8 mg/dL (ref 6–20)
CO2: 23 mmol/L (ref 22–32)
Calcium: 9 mg/dL (ref 8.9–10.3)
Chloride: 100 mmol/L (ref 98–111)
Creatinine, Ser: 0.91 mg/dL (ref 0.61–1.24)
GFR, Estimated: >60 mL/min (ref >60)
Glucose, Bld: 93 mg/dL (ref 70–99)
Potassium: 3.5 mmol/L (ref 3.5–5.1)
Sodium: 136 mmol/L (ref 135–145)

## 2022-09-30 MED ORDER — POLYETHYLENE GLYCOL 3350 17 G PO PACK
17.0000 g | PACK | Freq: Two times a day (BID) | ORAL | Status: DC
  Administered 2022-09-30 (×2): 17 g via ORAL
  Filled 2022-09-30 (×2): qty 1

## 2022-09-30 NOTE — Progress Notes (Signed)
[facility] Progress Note     Subjective: CC-  Having some gas pain this morning. Passing flatus and small bowel movement.   Objective: Vital signs in last 24 hours: Temp:  [97.7 F (36.5 C)-98.1 F (36.7 C)] 98.1 F (36.7 C) (02/22 0750) Pulse Rate:  [75-98] 75 (02/22 0750) Resp:  [16-17] 17 (02/22 0750) BP: (132-150)/(78-93) 150/93 (02/22 0750) SpO2:  [97 %-98 %] 97 % (02/22 0750) Last BM Date : 09/28/22  Intake/Output from previous day: No intake/output data recorded. Intake/Output this shift: No intake/output data recorded.  PE: Gen:  Alert, NAD, pleasant Pulm:  rate and effort normal on room air Abd: soft, ND, minimal TTP across the lower abdomen without rebound or guarding- stable exam from yesterday  Lab Results:  Recent Labs    09/29/22 1159 09/30/22 0310  WBC 10.2 7.5  HGB 14.7 14.6  HCT 44.1 41.7  PLT 276 281    BMET Recent Labs    09/29/22 1159 09/30/22 0310  NA 136 136  K 3.6 3.5  CL 104 100  CO2 19* 23  GLUCOSE 93 93  BUN 10 8  CREATININE 0.93 0.91  CALCIUM 9.2 9.0    PT/INR No results for input(s): "LABPROT", "INR" in the last 72 hours. CMP     Component Value Date/Time   NA 136 09/30/2022 0310   K 3.5 09/30/2022 0310   CL 100 09/30/2022 0310   CO2 23 09/30/2022 0310   GLUCOSE 93 09/30/2022 0310   BUN 8 09/30/2022 0310   CREATININE 0.91 09/30/2022 0310   CALCIUM 9.0 09/30/2022 0310   PROT 7.7 09/27/2022 1758   ALBUMIN 3.8 09/27/2022 1758   AST 20 09/27/2022 1758   ALT 34 09/27/2022 1758   ALKPHOS 80 09/27/2022 1758   BILITOT 1.2 09/27/2022 1758   GFRNONAA >60 09/30/2022 0310   Lipase     Component Value Date/Time   LIPASE 33 09/27/2022 1758       Studies/Results: No results found.  Anti-infectives: Anti-infectives (From admission, onward)    Start     Dose/Rate Route Frequency Ordered Stop   09/28/22 1000  terbinafine (LAMISIL) tablet 250 mg       Note to Pharmacy: Take with food     250 mg Oral  Daily 09/27/22 2356     09/28/22 0600  piperacillin-tazobactam (ZOSYN) IVPB 3.375 g  Status:  Discontinued        3.375 g 12.5 mL/hr over 240 Minutes Intravenous Every 8 hours 09/27/22 2244 09/27/22 2356   09/28/22 0015  piperacillin-tazobactam (ZOSYN) IVPB 4.5 g  Status:  Discontinued        4.5 g 200 mL/hr over 30 Minutes Intravenous Every 6 hours 09/28/22 0011 09/28/22 0012   09/28/22 0015  piperacillin-tazobactam (ZOSYN) IVPB 3.375 g        3.375 g 12.5 mL/hr over 240 Minutes Intravenous Every 8 hours 09/28/22 0012     09/27/22 2215  metroNIDAZOLE (FLAGYL) IVPB 500 mg  Status:  Discontinued        500 mg 100 mL/hr over 60 Minutes Intravenous Every 8 hours 09/27/22 2206 09/27/22 2234   09/27/22 2215  ciprofloxacin (CIPRO) IVPB 400 mg  Status:  Discontinued        400 mg 200 mL/hr over 60 Minutes Intravenous Every 12 hours 09/27/22 2206 09/27/22 2244        Assessment/Plan Sigmoid diverticulitis with contained perforation without abscess - WBC normal, afebrile, but some gas pain this AM. Will continue clears,  add miralax, continue to follow exam for now.  - No current indication for emergency surgery - Hopefully patient will improve with conservative treatment.  Discussed if he was to fail to improve may obtain follow up CT scan to rule out abscess.  - If patient improves with conservative therapies would recommend colonoscopy in ~6 weeks.     FEN: CLD, LR @ 139m/hr ID: [email or web address] VTE: [email or web address]   Eczema dermatitis Asthma  I reviewed last 24 h vitals and pain scores, last 48 h intake and output, and last 24 h labs and trends.    LOS: 3 days    CClovis Rodriguez M[facility] 09/30/2022, 10:03 AM Please see Amion for pager number during day hours 7:00am-4:30pm

## 2022-10-01 ENCOUNTER — Encounter (HOSPITAL_COMMUNITY): Payer: Self-pay

## 2022-10-01 ENCOUNTER — Other Ambulatory Visit: Payer: Self-pay

## 2022-10-01 ENCOUNTER — Inpatient Hospital Stay (HOSPITAL_COMMUNITY): Payer: BC Managed Care – PPO | Admitting: Certified Registered"

## 2022-10-01 ENCOUNTER — Inpatient Hospital Stay (HOSPITAL_COMMUNITY): Payer: BC Managed Care – PPO

## 2022-10-01 ENCOUNTER — Encounter (HOSPITAL_COMMUNITY): Admission: EM | Disposition: A | Discharge status: Home Health | Payer: Self-pay | Source: Home / Self Care

## 2022-10-01 HISTORY — PX: COLON RESECTION SIGMOID: SHX6737

## 2022-10-01 HISTORY — PX: LAPAROTOMY: SHX154

## 2022-10-01 HISTORY — PX: COLOSTOMY: SHX63

## 2022-10-01 LAB — CBC
HCT: 44.1 % (ref 39.0–52.0)
Hemoglobin: 15.5 g/dL (ref 13.0–17.0)
MCH: 31.2 pg (ref 26.0–34.0)
MCHC: 35.1 g/dL (ref 30.0–36.0)
MCV: 88.7 fL (ref 80.0–100.0)
Platelets: 326 10*3/uL (ref 150–400)
RBC: 4.97 MIL/uL (ref 4.22–5.81)
RDW: 12.1 % (ref 11.5–15.5)
WBC: 13.9 10*3/uL — ABNORMAL HIGH (ref 4.0–10.5)
nRBC: 0 % (ref 0.0–0.2)

## 2022-10-01 LAB — BASIC METABOLIC PANEL
Anion gap: 12 (ref 5–15)
BUN: 7 mg/dL (ref 6–20)
CO2: 24 mmol/L (ref 22–32)
Calcium: 9 mg/dL (ref 8.9–10.3)
Chloride: 98 mmol/L (ref 98–111)
Creatinine, Ser: 0.95 mg/dL (ref 0.61–1.24)
GFR, Estimated: >60 mL/min (ref >60)
Glucose, Bld: 118 mg/dL — ABNORMAL HIGH (ref 70–99)
Potassium: 3.5 mmol/L (ref 3.5–5.1)
Sodium: 134 mmol/L — ABNORMAL LOW (ref 135–145)

## 2022-10-01 LAB — TYPE AND SCREEN
ABO/RH(D): B POS
Antibody Screen: NEGATIVE

## 2022-10-01 LAB — MAGNESIUM: Magnesium: 2 mg/dL (ref 1.7–2.4)

## 2022-10-01 LAB — ABO/RH: ABO/RH(D): B POS

## 2022-10-01 LAB — SURGICAL PCR SCREEN
MRSA, PCR: NEGATIVE
Staphylococcus aureus: NEGATIVE

## 2022-10-01 SURGERY — LAPAROTOMY, EXPLORATORY
Anesthesia: General | Site: Abdomen

## 2022-10-01 MED ORDER — DEXMEDETOMIDINE HCL IN NACL 80 MCG/20ML IV SOLN
INTRAVENOUS | Status: DC | PRN
  Administered 2022-10-01: 8 ug via BUCCAL
  Administered 2022-10-01: 4 ug via BUCCAL

## 2022-10-01 MED ORDER — HYDROMORPHONE HCL 1 MG/ML IJ SOLN
INTRAMUSCULAR | Status: AC
  Filled 2022-10-01: qty 1

## 2022-10-01 MED ORDER — IOHEXOL 350 MG/ML SOLN
75.0000 mL | Freq: Once | INTRAVENOUS | Status: AC | PRN
  Administered 2022-10-01: 75 mL via INTRAVENOUS

## 2022-10-01 MED ORDER — MIDAZOLAM HCL 2 MG/2ML IJ SOLN
INTRAMUSCULAR | Status: AC
  Filled 2022-10-01: qty 2

## 2022-10-01 MED ORDER — NALOXONE HCL 0.4 MG/ML IJ SOLN: 0.4000 mg | INTRAMUSCULAR | Status: DC | PRN

## 2022-10-01 MED ORDER — ONDANSETRON HCL 4 MG/2ML IJ SOLN: 4.0000 mg | Freq: Four times a day (QID) | INTRAMUSCULAR | Status: DC | PRN

## 2022-10-01 MED ORDER — HYDROMORPHONE HCL 1 MG/ML IJ SOLN
0.2500 mg | INTRAMUSCULAR | Status: DC | PRN
  Administered 2022-10-01 (×4): 0.5 mg via INTRAVENOUS

## 2022-10-01 MED ORDER — LIDOCAINE 2% (20 MG/ML) 5 ML SYRINGE
INTRAMUSCULAR | Status: DC | PRN
  Administered 2022-10-01: 100 mg via INTRAVENOUS

## 2022-10-01 MED ORDER — DIPHENHYDRAMINE HCL 12.5 MG/5ML PO ELIX: 12.5000 mg | ORAL_SOLUTION | Freq: Four times a day (QID) | ORAL | Status: DC | PRN

## 2022-10-01 MED ORDER — FENTANYL CITRATE (PF) 100 MCG/2ML IJ SOLN: 50.0000 ug | Freq: Once | INTRAMUSCULAR | Status: AC

## 2022-10-01 MED ORDER — AMISULPRIDE (ANTIEMETIC) 5 MG/2ML IV SOLN: 10.0000 mg | Freq: Once | INTRAVENOUS | Status: DC | PRN

## 2022-10-01 MED ORDER — MIDAZOLAM HCL 2 MG/2ML IJ SOLN
INTRAMUSCULAR | Status: DC | PRN
  Administered 2022-10-01: 2 mg via INTRAVENOUS

## 2022-10-01 MED ORDER — ENOXAPARIN SODIUM 40 MG/0.4ML IJ SOSY
40.0000 mg | PREFILLED_SYRINGE | INTRAMUSCULAR | Status: DC
  Administered 2022-10-02 – 2022-10-08 (×7): 40 mg via SUBCUTANEOUS
  Filled 2022-10-01 (×7): qty 0.4

## 2022-10-01 MED ORDER — ORAL CARE MOUTH RINSE: 15.0000 mL | Freq: Once | OROMUCOSAL | Status: DC

## 2022-10-01 MED ORDER — DIPHENHYDRAMINE HCL 50 MG/ML IJ SOLN: 12.5000 mg | Freq: Four times a day (QID) | INTRAMUSCULAR | Status: DC | PRN

## 2022-10-01 MED ORDER — FENTANYL CITRATE (PF) 250 MCG/5ML IJ SOLN
INTRAMUSCULAR | Status: AC
  Filled 2022-10-01: qty 5

## 2022-10-01 MED ORDER — PROPOFOL 10 MG/ML IV BOLUS
INTRAVENOUS | Status: AC
  Filled 2022-10-01: qty 20

## 2022-10-01 MED ORDER — DEXAMETHASONE SODIUM PHOSPHATE 10 MG/ML IJ SOLN
INTRAMUSCULAR | Status: DC | PRN
  Administered 2022-10-01: 10 mg via INTRAVENOUS

## 2022-10-01 MED ORDER — ONDANSETRON HCL 4 MG/2ML IJ SOLN
INTRAMUSCULAR | Status: AC
  Filled 2022-10-01: qty 2

## 2022-10-01 MED ORDER — HYDROMORPHONE 1 MG/ML IV SOLN
INTRAVENOUS | Status: DC
  Administered 2022-10-01: 1.8 mg via INTRAVENOUS
  Administered 2022-10-01: 4.2 mg via INTRAVENOUS
  Administered 2022-10-01: 30 mg via INTRAVENOUS
  Administered 2022-10-02: 1.5 mg via INTRAVENOUS
  Administered 2022-10-02: 4.5 mg via INTRAVENOUS
  Administered 2022-10-02: 2.4 mg via INTRAVENOUS
  Administered 2022-10-02: 2.7 mg via INTRAVENOUS
  Administered 2022-10-02: 3 mg via INTRAVENOUS
  Administered 2022-10-02: 30 mg via INTRAVENOUS
  Administered 2022-10-03: 3.6 mg via INTRAVENOUS
  Administered 2022-10-03: 2.7 mg via INTRAVENOUS
  Administered 2022-10-03: 2.1 mg via INTRAVENOUS
  Administered 2022-10-03: 4.2 mg via INTRAVENOUS
  Administered 2022-10-03: 2.4 mg via INTRAVENOUS
  Administered 2022-10-04: 0.3 mg via INTRAVENOUS
  Administered 2022-10-04: 2.1 mg via INTRAVENOUS
  Administered 2022-10-04: 3.3 mg via INTRAVENOUS
  Administered 2022-10-04: 0.9 mg via INTRAVENOUS
  Administered 2022-10-04: 30 mg via INTRAVENOUS
  Administered 2022-10-05: 1.2 mg via INTRAVENOUS
  Filled 2022-10-01 (×2): qty 30

## 2022-10-01 MED ORDER — LIDOCAINE 2% (20 MG/ML) 5 ML SYRINGE
INTRAMUSCULAR | Status: AC
  Filled 2022-10-01: qty 5

## 2022-10-01 MED ORDER — METHOCARBAMOL 1000 MG/10ML IJ SOLN: 500.0000 mg | Freq: Three times a day (TID) | INTRAVENOUS | Status: DC | PRN

## 2022-10-01 MED ORDER — CHLORHEXIDINE GLUCONATE 0.12 % MT SOLN: 15.0000 mL | Freq: Once | OROMUCOSAL | Status: DC

## 2022-10-01 MED ORDER — 0.9 % SODIUM CHLORIDE (POUR BTL) OPTIME
TOPICAL | Status: DC | PRN
  Administered 2022-10-01 (×4): 1000 mL

## 2022-10-01 MED ORDER — FENTANYL CITRATE (PF) 100 MCG/2ML IJ SOLN
INTRAMUSCULAR | Status: AC
  Administered 2022-10-01: 50 ug via INTRAVENOUS
  Filled 2022-10-01: qty 2

## 2022-10-01 MED ORDER — CHLORHEXIDINE GLUCONATE 0.12 % MT SOLN
OROMUCOSAL | Status: AC
  Filled 2022-10-01: qty 15

## 2022-10-01 MED ORDER — ONDANSETRON HCL 4 MG/2ML IJ SOLN
INTRAMUSCULAR | Status: DC | PRN
  Administered 2022-10-01: 4 mg via INTRAVENOUS

## 2022-10-01 MED ORDER — LACTATED RINGERS IV SOLN: INTRAVENOUS | Status: DC

## 2022-10-01 MED ORDER — ACETAMINOPHEN 10 MG/ML IV SOLN
1000.0000 mg | Freq: Four times a day (QID) | INTRAVENOUS | Status: AC
  Administered 2022-10-01 – 2022-10-03 (×6): 1000 mg via INTRAVENOUS
  Filled 2022-10-01 (×6): qty 100

## 2022-10-01 MED ORDER — SUGAMMADEX SODIUM 500 MG/5ML IV SOLN
INTRAVENOUS | Status: DC | PRN
  Administered 2022-10-01: 240 mg via INTRAVENOUS

## 2022-10-01 MED ORDER — ACETAMINOPHEN 10 MG/ML IV SOLN
INTRAVENOUS | Status: DC | PRN
  Administered 2022-10-01: 1000 mg via INTRAVENOUS

## 2022-10-01 MED ORDER — ROCURONIUM BROMIDE 10 MG/ML (PF) SYRINGE
PREFILLED_SYRINGE | INTRAVENOUS | Status: AC
  Filled 2022-10-01: qty 10

## 2022-10-01 MED ORDER — ROCURONIUM BROMIDE 10 MG/ML (PF) SYRINGE
PREFILLED_SYRINGE | INTRAVENOUS | Status: DC | PRN
  Administered 2022-10-01: 70 mg via INTRAVENOUS
  Administered 2022-10-01: 20 mg via INTRAVENOUS
  Administered 2022-10-01 (×2): 30 mg via INTRAVENOUS

## 2022-10-01 MED ORDER — DEXMEDETOMIDINE HCL IN NACL 80 MCG/20ML IV SOLN
INTRAVENOUS | Status: AC
  Filled 2022-10-01: qty 20

## 2022-10-01 MED ORDER — SODIUM CHLORIDE 0.9% FLUSH: 9.0000 mL | INTRAVENOUS | Status: DC | PRN

## 2022-10-01 MED ORDER — SUGAMMADEX SODIUM 500 MG/5ML IV SOLN
INTRAVENOUS | Status: AC
  Filled 2022-10-01: qty 5

## 2022-10-01 MED ORDER — PROPOFOL 10 MG/ML IV BOLUS
INTRAVENOUS | Status: DC | PRN
  Administered 2022-10-01: 200 mg via INTRAVENOUS
  Administered 2022-10-01: 30 mg via INTRAVENOUS

## 2022-10-01 MED ORDER — DEXAMETHASONE SODIUM PHOSPHATE 10 MG/ML IJ SOLN
INTRAMUSCULAR | Status: AC
  Filled 2022-10-01: qty 1

## 2022-10-01 MED ORDER — ACETAMINOPHEN 10 MG/ML IV SOLN
INTRAVENOUS | Status: AC
  Filled 2022-10-01: qty 100

## 2022-10-01 MED ORDER — FENTANYL CITRATE (PF) 100 MCG/2ML IJ SOLN
INTRAMUSCULAR | Status: DC | PRN
  Administered 2022-10-01 (×3): 100 ug via INTRAVENOUS
  Administered 2022-10-01 (×2): 50 ug via INTRAVENOUS

## 2022-10-01 SURGICAL SUPPLY — 45 items
BAG COUNTER SPONGE SURGICOUNT (BAG) ×2 IMPLANT
BLADE CLIPPER SURG (BLADE) IMPLANT
BNDG GAUZE DERMACEA FLUFF 4 (GAUZE/BANDAGES/DRESSINGS) IMPLANT
CANISTER SUCT 3000ML PPV (MISCELLANEOUS) ×2 IMPLANT
CHLORAPREP W/TINT 26 (MISCELLANEOUS) ×2 IMPLANT
COVER SURGICAL LIGHT HANDLE (MISCELLANEOUS) ×2 IMPLANT
DRAPE LAPAROSCOPIC ABDOMINAL (DRAPES) ×2 IMPLANT
DRAPE WARM FLUID 44X44 (DRAPES) ×2 IMPLANT
DRSG OPSITE POSTOP 4X10 (GAUZE/BANDAGES/DRESSINGS) IMPLANT
DRSG OPSITE POSTOP 4X12 (GAUZE/BANDAGES/DRESSINGS) IMPLANT
DRSG OPSITE POSTOP 4X8 (GAUZE/BANDAGES/DRESSINGS) IMPLANT
ELECT BLADE 6.5 EXT (BLADE) IMPLANT
ELECT CAUTERY BLADE 6.4 (BLADE) ×2 IMPLANT
ELECT REM PT RETURN 9FT ADLT (ELECTROSURGICAL) ×2
ELECTRODE REM PT RTRN 9FT ADLT (ELECTROSURGICAL) ×2 IMPLANT
GAUZE PAD ABD 8X10 STRL (GAUZE/BANDAGES/DRESSINGS) IMPLANT
GAUZE SPONGE 4X4 12PLY STRL (GAUZE/BANDAGES/DRESSINGS) IMPLANT
GLOVE BIO SURGEON STRL SZ 6 (GLOVE) ×2 IMPLANT
GLOVE INDICATOR 6.5 STRL GRN (GLOVE) ×2 IMPLANT
GOWN STRL REUS W/ TWL LRG LVL3 (GOWN DISPOSABLE) ×4 IMPLANT
GOWN STRL REUS W/TWL LRG LVL3 (GOWN DISPOSABLE) ×8
HANDLE SUCTION POOLE (INSTRUMENTS) ×2 IMPLANT
KIT BASIN OR (CUSTOM PROCEDURE TRAY) ×2 IMPLANT
KIT OSTOMY DRAINABLE 2.75 STR (WOUND CARE) IMPLANT
KIT TURNOVER KIT B (KITS) ×2 IMPLANT
LIGASURE IMPACT 36 18CM CVD LR (INSTRUMENTS) IMPLANT
MANIFOLD NEPTUNE II (INSTRUMENTS) IMPLANT
NS IRRIG 1000ML POUR BTL (IV SOLUTION) ×4 IMPLANT
PACK GENERAL/GYN (CUSTOM PROCEDURE TRAY) ×2 IMPLANT
PAD ARMBOARD 7.5X6 YLW CONV (MISCELLANEOUS) ×2 IMPLANT
PENCIL SMOKE EVACUATOR (MISCELLANEOUS) ×2 IMPLANT
SPONGE T-LAP 18X18 ~~LOC~~+RFID (SPONGE) IMPLANT
STAPLER CVD CUT GN 40 RELOAD (ENDOMECHANICALS) ×2 IMPLANT
STAPLER CVD CUT GRN 40 RELOAD (ENDOMECHANICALS) IMPLANT
STAPLER VISISTAT 35W (STAPLE) ×2 IMPLANT
SUCTION POOLE HANDLE (INSTRUMENTS) ×2
SUT PDS AB 1 TP1 96 (SUTURE) ×4 IMPLANT
SUT PROLENE 2 0 SH DA (SUTURE) IMPLANT
SUT SILK 2 0 SH CR/8 (SUTURE) ×2 IMPLANT
SUT SILK 2 0 TIES 10X30 (SUTURE) ×2 IMPLANT
SUT SILK 3 0 SH CR/8 (SUTURE) ×2 IMPLANT
SUT SILK 3 0 TIES 10X30 (SUTURE) ×2 IMPLANT
SUT VIC AB 3-0 SH 18 (SUTURE) IMPLANT
TOWEL GREEN STERILE (TOWEL DISPOSABLE) ×2 IMPLANT
TRAY FOLEY MTR SLVR 14FR STAT (SET/KITS/TRAYS/PACK) IMPLANT

## 2022-10-01 NOTE — Anesthesia Preprocedure Evaluation (Signed)
Anesthesia Evaluation  Patient identified by MRN, date of birth, ID band Patient awake    Reviewed: Allergy & Precautions, NPO status , Patient's Chart, lab work & pertinent test results  History of Anesthesia Complications Negative for: history of anesthetic complications  Airway Mallampati: II  TM Distance: >3 FB Neck ROM: Full    Dental no notable dental hx.    Pulmonary asthma , Patient abstained from smoking.   Pulmonary exam normal        Cardiovascular negative cardio ROS Normal cardiovascular exam     Neuro/Psych negative neurological ROS  negative psych ROS   GI/Hepatic Neg liver ROS,,,Sigmoid Diverticulitis   Endo/Other  negative endocrine ROS    Renal/GU negative Renal ROS  negative genitourinary   Musculoskeletal negative musculoskeletal ROS (+)    Abdominal   Peds  Hematology negative hematology ROS (+)   Anesthesia Other Findings Day of surgery medications reviewed with patient.  Reproductive/Obstetrics negative OB ROS                              Anesthesia Physical Anesthesia Plan  ASA: 2  Anesthesia Plan: General   Post-op Pain Management: Tylenol PO (pre-op)*   Induction: Intravenous  PONV Risk Score and Plan: 3 and Treatment may vary due to age or medical condition, Midazolam, Dexamethasone and Ondansetron  Airway Management Planned: Oral ETT  Additional Equipment: None  Intra-op Plan:   Post-operative Plan: Extubation in OR  Informed Consent: I have reviewed the patients History and Physical, chart, labs and discussed the procedure including the risks, benefits and alternatives for the proposed anesthesia with the patient or authorized representative who has indicated his/her understanding and acceptance.     Dental advisory given  Plan Discussed with: CRNA  Anesthesia Plan Comments:          Anesthesia Quick Evaluation

## 2022-10-01 NOTE — Anesthesia Procedure Notes (Signed)
Procedure Name: Intubation Date/Time: 10/01/2022 11:56 AM  Performed by: Barrington Ellison, CRNAPre-anesthesia Checklist: Patient identified, Emergency Drugs available, Suction available and Patient being monitored Patient Re-evaluated:Patient Re-evaluated prior to induction Oxygen Delivery Method: Circle System Utilized Preoxygenation: Pre-oxygenation with 100% oxygen Induction Type: IV induction Ventilation: Mask ventilation without difficulty, Two handed mask ventilation required and Oral airway inserted - appropriate to patient size Laryngoscope Size: Mac and 4 Grade View: Grade I Tube type: Oral Tube size: 7.5 mm Number of attempts: 1 Airway Equipment and Method: Stylet and Oral airway Placement Confirmation: ETT inserted through vocal cords under direct vision, positive ETCO2 and breath sounds checked- equal and bilateral Secured at: 22 cm Tube secured with: Tape Dental Injury: Teeth and Oropharynx as per pre-operative assessment

## 2022-10-01 NOTE — Progress Notes (Signed)
Informed Dr. Grandville Silos that per xray, NG tube needs to be adjusted by 6 cm more,per Grandville Silos MD, may proceed with adjustment. Xray ordered for placement confirmation. Noted NG tube at 49 cms, adjusted to 55 cms and continued on low intermittent suction.

## 2022-10-01 NOTE — Progress Notes (Signed)
[facility] Progress Note     Subjective: CC-  Had continued pain throughout the day yesterday, was febrile yesterday afternoon.  CT overnight demonstrates worsening inflammation and progressive increase in extraluminal air, and his white blood cell count is elevated again this morning at 13.9.  Reports nausea this morning.  Reports his IV fluids were not running for about 11 hours yesterday.  Objective: Vital signs in last 24 hours: Temp:  [97.8 F (36.6 C)-101.7 F (38.7 C)] 98.6 F (37 C) (02/23 0454) Pulse Rate:  [88-104] 88 (02/23 0454) Resp:  [17-18] 18 (02/22 1701) BP: (130-145)/(78-94) 133/78 (02/23 0454) SpO2:  [96 %-98 %] 96 % (02/23 0454) Last BM Date : 09/30/22  Intake/Output from previous day: 02/22 0701 - 02/23 0700 In: 2514.1 [P.O.:1329; I.V.:1050.7; IV Piggyback:134.4] Out: -  Intake/Output this shift: No intake/output data recorded.  PE: Gen:  Alert, NAD, pleasant Pulm:  rate and effort normal on room air Abd: soft, wildly distended, TTP across the lower abdomen  Lab Results:  Recent Labs    09/30/22 0310 10/01/22 0312  WBC 7.5 13.9*  HGB 14.6 15.5  HCT 41.7 44.1  PLT 281 326    BMET Recent Labs    09/30/22 0310 10/01/22 0312  NA 136 134*  K 3.5 3.5  CL 100 98  CO2 23 24  GLUCOSE 93 118*  BUN 8 7  CREATININE 0.91 0.95  CALCIUM 9.0 9.0    PT/INR No results for input(s): "LABPROT", "INR" in the last 72 hours. CMP     Component Value Date/Time   NA 134 (L) 10/01/2022 0312   K 3.5 10/01/2022 0312   CL 98 10/01/2022 0312   CO2 24 10/01/2022 0312   GLUCOSE 118 (H) 10/01/2022 0312   BUN 7 10/01/2022 0312   CREATININE 0.95 10/01/2022 0312   CALCIUM 9.0 10/01/2022 0312   PROT 7.7 09/27/2022 1758   ALBUMIN 3.8 09/27/2022 1758   AST 20 09/27/2022 1758   ALT 34 09/27/2022 1758   ALKPHOS 80 09/27/2022 1758   BILITOT 1.2 09/27/2022 1758   GFRNONAA >60 10/01/2022 0312   Lipase     Component Value Date/Time   LIPASE 33  09/27/2022 1758       Studies/Results: CT ABDOMEN PELVIS W CONTRAST  Result Date: 10/01/2022 CLINICAL DATA:  Perforated sigmoid diverticulitis. EXAM: CT ABDOMEN AND PELVIS WITH CONTRAST TECHNIQUE: Multidetector CT imaging of the abdomen and pelvis was performed using the standard protocol following bolus administration of intravenous contrast. RADIATION DOSE REDUCTION: This exam was performed according to the departmental dose-optimization program which includes automated exposure control, adjustment of the mA and/or kV according to patient size and/or use of iterative reconstruction technique. CONTRAST:  62m OMNIPAQUE IOHEXOL 350 MG/ML SOLN COMPARISON:  CT abdomen pelvis dated 09/27/2022. FINDINGS: Lower chest: The visualized lung bases are clear. There is a small pneumoperitoneum. No free fluid. Hepatobiliary: The liver is unremarkable. No biliary dilatation. The gallbladder is unremarkable. Pancreas: Unremarkable. No pancreatic ductal dilatation or surrounding inflammatory changes. Spleen: Normal in size without focal abnormality. Adrenals/Urinary Tract: The adrenal glands unremarkable. The kidneys, visualized ureters, and urinary bladder appear unremarkable. Stomach/Bowel: There is sigmoid diverticulitis. There is progression of inflammatory changes and increase in the amount of extraluminal air compared to the prior CT. No drainable fluid collection or abscess. Mildly dilated loops of small bowel in the upper abdomen measure up to 4 cm, likely ileus. Developing obstruction is less likely but not excluded. Clinical correlation is recommended. Appendectomy. Vascular/Lymphatic: The  abdominal aorta and IVC are unremarkable. No portal venous gas. There is no adenopathy. Reproductive: The prostate and seminal vesicles are grossly unremarkable. No pelvic mass. Other: Small fat containing left inguinal hernia. Small fat containing umbilical hernia. Musculoskeletal: No acute or significant osseous findings.  IMPRESSION: 1. Perforated sigmoid diverticulitis with progression of inflammatory changes and increase in the amount of extraluminal air compared to the prior CT. No drainable fluid collection or abscess. 2. Mildly dilated loops of small bowel in the upper abdomen, likely ileus. Developing obstruction is less likely but not excluded. Electronically Signed   By: Anner Crete M.D.   On: 10/01/2022 00:59    Anti-infectives: Anti-infectives (From admission, onward)    Start     Dose/Rate Route Frequency Ordered Stop   09/28/22 1000  terbinafine (LAMISIL) tablet 250 mg       Note to Pharmacy: Take with food     250 mg Oral Daily 09/27/22 2356     09/28/22 0600  piperacillin-tazobactam (ZOSYN) IVPB 3.375 g  Status:  Discontinued        3.375 g 12.5 mL/hr over 240 Minutes Intravenous Every 8 hours 09/27/22 2244 09/27/22 2356   09/28/22 0015  piperacillin-tazobactam (ZOSYN) IVPB 4.5 g  Status:  Discontinued        4.5 g 200 mL/hr over 30 Minutes Intravenous Every 6 hours 09/28/22 0011 09/28/22 0012   09/28/22 0015  piperacillin-tazobactam (ZOSYN) IVPB 3.375 g        3.375 g 12.5 mL/hr over 240 Minutes Intravenous Every 8 hours 09/28/22 0012     09/27/22 2215  metroNIDAZOLE (FLAGYL) IVPB 500 mg  Status:  Discontinued        500 mg 100 mL/hr over 60 Minutes Intravenous Every 8 hours 09/27/22 2206 09/27/22 2234   09/27/22 2215  ciprofloxacin (CIPRO) IVPB 400 mg  Status:  Discontinued        400 mg 200 mL/hr over 60 Minutes Intravenous Every 12 hours 09/27/22 2206 09/27/22 2244        Assessment/Plan Sigmoid diverticulitis with contained perforation without abscess -Unfortunately has had worsening pain, leukocytosis, and fever despite bowel rest and broad-spectrum IV antibiotics.  I recommend proceeding to the OR for sigmoid colectomy, likely end colostomy today.  I discussed the surgery with him and his wife at the bedside and we went over risks of surgery.  I think if surgery is not pursued  today this is likely to progress further with worsening pain, probable abscess versus free perforation, and that he will ultimately end up having surgery this admission regardless and so I recommend going sooner than later in order to optimize his outcome.  Questions were welcomed and answered to their satisfaction.  He is going to talk with his family before proceeding.  FEN: NPO, LR @ 179m/hr ID: [email or web address] VTE: [email or web address]   Eczema dermatitis Asthma  I reviewed last 24 h vitals and pain scores, last 48 h intake and output, and last 24 h labs and trends.    LOS: 4 days    CClovis Riley [facility] 10/01/2022, 8:07 AM Please see Amion for pager number during day hours 7:00am-4:30pm

## 2022-10-01 NOTE — Anesthesia Postprocedure Evaluation (Signed)
Anesthesia Post Note  Patient: George Rodriguez  Procedure(s) Performed: EXPLORATORY LAPAROTOMY (Abdomen) COLON RESECTION SIGMOID (Abdomen) COLOSTOMY (Abdomen)     Patient location during evaluation: PACU Anesthesia Type: General Level of consciousness: awake and alert Pain management: pain level controlled Vital Signs Assessment: post-procedure vital signs reviewed and stable Respiratory status: spontaneous breathing, nonlabored ventilation and respiratory function stable Cardiovascular status: blood pressure returned to baseline Postop Assessment: no apparent nausea or vomiting Anesthetic complications: no   No notable events documented.  Last Vitals:  Vitals:   10/01/22 1500 10/01/22 1501  BP: 112/68 112/68  Pulse: 98 (!) 103  Resp: (!) 9 12  Temp:    SpO2: 94% 94%    Last Pain:  Vitals:   10/01/22 1501  TempSrc:   PainSc: Asleep                 Marthenia Rolling

## 2022-10-01 NOTE — Progress Notes (Signed)
Inquired with Reather Laurence, MD if patient can still have PO pain mecicines after midnight as he is NPO, per Lovick MD, pt can have sips with medicines.

## 2022-10-01 NOTE — Plan of Care (Signed)
Instructed pt on how to use the incentive spirometer, pt demonstrated correct technique and stated had used it before in the past.

## 2022-10-01 NOTE — Transfer of Care (Signed)
Immediate Anesthesia Transfer of Care Note  Patient: George Rodriguez  Procedure(s) Performed: EXPLORATORY LAPAROTOMY (Abdomen) COLON RESECTION SIGMOID (Abdomen) COLOSTOMY (Abdomen)  Patient Location: PACU  Anesthesia Type:General  Level of Consciousness: awake and oriented  Airway & Oxygen Therapy: Patient Spontanous Breathing and Patient connected to face mask oxygen  Post-op Assessment: Report given to RN  Post vital signs: Reviewed and stable  Last Vitals:  Vitals Value Taken Time  BP 159/80 10/01/22 1408  Temp    Pulse 101 10/01/22 1412  Resp 11 10/01/22 1412  SpO2 94 % 10/01/22 1412  Vitals shown include unvalidated device data.  Last Pain:  Vitals:   10/01/22 0848  TempSrc:   PainSc: 8       Patients Stated Pain Goal: 2 ([id number])  Complications: No notable events documented.

## 2022-10-01 NOTE — Consult Note (Signed)
WOC consulted for new end colostomy [facility] nursing team will follow up for ostomy teaching and post op ostomy assessment Monday 10/04/22  Karnes [place], [place], [place]

## 2022-10-01 NOTE — Plan of Care (Signed)
  Problem: Clinical Measurements: Goal: Will remain free from infection Outcome: Not Progressing   Problem: Pain Managment: Goal: General experience of comfort will improve Outcome: Not Progressing   

## 2022-10-01 NOTE — Op Note (Signed)
Operative Note  George Rodriguez  [id number]  [id number]  10/01/2022   Surgeon: Romana Juniper MD FACS   Assistant: Obie Dredge PA-C   Procedure performed: exploratory laparotomy, sigmoid partial colectomy with end colostomy   Preop diagnosis: perforated diverticulitis Post-op diagnosis/intraop findings: Same, perforation into the mesentery with abscess; extensive inflammatory change along the sigmoid mesentery, developing ileus   Specimens: Sigmoid colon Retained items: No EBL: [phone] Complications: none   Description of procedure: After obtaining informed consent the patient was taken to the operating room and placed supine on operating room table where general endotracheal anesthesia was initiated, preoperative antibiotics were administered, SCDs applied, and a formal timeout was performed.  The abdomen was prepped and draped in usual sterile fashion and a midline laparotomy was created.  Upon entry we encountered purulent ascites.  The small bowel was noted to be diffusely dilated with inflammatory/purulent adhesions to the sigmoid colon and sigmoid mesentery.  A Bookwalter retractor was placed for aid in visualization.  The small bowel adhesions to the sigmoid were carefully bluntly divided with finger fracture and the small bowel was mobilized in the upper abdomen.  The sigmoid colon for a span of about 15 cm was intensely inflamed with significant edema and inflammation extending throughout the sigmoid mesentery.  We were able to palpate normal caliber soft sigmoid colon distal to this.  This was transected with a green load contour stapler and a 2-0 Prolene mark to the right side of the staple line.  We then moved to the proximal aspect and identified a soft area of sigmoid colon proximal to the disease process which was additionally divided with a stapler.  The intervening mesentery was divided with the LigaSure but this was so friable that much of it came apart just with manipulation  and retraction.  There was an abscess cavity within the mesentery which drained at this point.  Some bleeding along the avulsed mesentery was controlled with direct pressure and 2-0 silk suture ligation.  The specimen was handed off and the abdominal cavity was then irrigated with 3 L of warm sterile saline; the effluent was clear.  Hemostasis was confirmed.  We then removed the Bookwalter retractor and a colostomy site was created in the left upper quadrant.  The stapled end of the descending colon was brought out through this and secured with a Babcock.  The fascia was closed with running looped #1 PDS starting at either end and tying centrally.  A damp to dry dressing was placed.  The staple line was then excised from the colostomy and this was matured with interrupted 3-0 Vicryl's.  This was digitized and confirmed to be patent below the fascia.  An ostomy appliance was then placed.  The patient was then awakened, extubated and taken to PACU in stable condition.    All counts were correct at the completion of the case.

## 2022-10-01 NOTE — Progress Notes (Signed)
Patient NPO since midnight. Order placed and patient headed to procedure within 25 minutes.

## 2022-10-02 ENCOUNTER — Encounter (HOSPITAL_COMMUNITY): Payer: Self-pay | Admitting: Surgery

## 2022-10-02 LAB — BASIC METABOLIC PANEL
Anion gap: 11 (ref 5–15)
BUN: 11 mg/dL (ref 6–20)
CO2: 25 mmol/L (ref 22–32)
Calcium: 8.5 mg/dL — ABNORMAL LOW (ref 8.9–10.3)
Chloride: 98 mmol/L (ref 98–111)
Creatinine, Ser: 0.92 mg/dL (ref 0.61–1.24)
GFR, Estimated: >60 mL/min (ref >60)
Glucose, Bld: 133 mg/dL — ABNORMAL HIGH (ref 70–99)
Potassium: 3.8 mmol/L (ref 3.5–5.1)
Sodium: 134 mmol/L — ABNORMAL LOW (ref 135–145)

## 2022-10-02 LAB — CBC
HCT: 41.5 % (ref 39.0–52.0)
Hemoglobin: 13.9 g/dL (ref 13.0–17.0)
MCH: 30.6 pg (ref 26.0–34.0)
MCHC: 33.5 g/dL (ref 30.0–36.0)
MCV: 91.4 fL (ref 80.0–100.0)
Platelets: 361 10*3/uL (ref 150–400)
RBC: 4.54 MIL/uL (ref 4.22–5.81)
RDW: 12.1 % (ref 11.5–15.5)
WBC: 13.1 10*3/uL — ABNORMAL HIGH (ref 4.0–10.5)
nRBC: 0 % (ref 0.0–0.2)

## 2022-10-02 LAB — MAGNESIUM: Magnesium: 2.1 mg/dL (ref 1.7–2.4)

## 2022-10-02 NOTE — Progress Notes (Signed)
1 Day Post-Op Exlap, Hartman's  Subjective: Pain controlled with PCA, UOP picking up, NG functioning, dressing change went well, thirsty  Objective: Vital signs in last 24 hours: Temp:  [97.6 F (36.4 C)-98.9 F (37.2 C)] 98 F (36.7 C) (02/24 0750) Pulse Rate:  [72-113] 93 (02/24 0750) Resp:  [9-18] 18 (02/24 0750) BP: (105-159)/(68-95) 146/87 (02/24 0750) SpO2:  [90 %-99 %] 99 % (02/24 0750)   Intake/Output from previous day: 02/23 0701 - 02/24 0700 In: 2641.8 [P.O.:125; I.V.:2516.8] Out: 2100 [Urine:1250; Emesis/NG output:300; Stool:450; Blood:100] Intake/Output this shift: No intake/output data recorded.   General appearance: alert and cooperative GI: soft, appropriately TTP  Incision: no significant drainage  Lab Results:  Recent Labs    10/01/22 0312 10/02/22 0256  WBC 13.9* 13.1*  HGB 15.5 13.9  HCT 44.1 41.5  PLT 326 361   BMET Recent Labs    10/01/22 0312 10/02/22 0256  NA 134* 134*  K 3.5 3.8  CL 98 98  CO2 24 25  GLUCOSE 118* 133*  BUN 7 11  CREATININE 0.95 0.92  CALCIUM 9.0 8.5*   PT/INR No results for input(s): "LABPROT", "INR" in the last 72 hours. ABG No results for input(s): "PHART", "HCO3" in the last 72 hours.  Invalid input(s): "PCO2", "PO2"  MEDS, Scheduled  enoxaparin (LOVENOX) injection  40 mg Subcutaneous Q24H   HYDROmorphone   Intravenous Q4H    Studies/Results: DG Abd Portable 1V  Result Date: 10/01/2022 CLINICAL DATA:  Small-bowel obstruction EXAM: PORTABLE ABDOMEN - 1 VIEW COMPARISON:  3:51 p.m. FINDINGS: Multiple gas-filled loops of small bowel are again seen throughout the visualized upper and mid abdomen in keeping with changes of a distal small bowel obstruction. Nasogastric tube is seen with its tip within the proximal body of the stomach. No free intraperitoneal gas identified. IMPRESSION: 1. Nasogastric tube tip within the proximal body of the stomach. 2. Persistent distal small bowel obstruction. Electronically  Signed   By: Fidela Salisbury M.D.   On: 10/01/2022 21:38   DG Abd Portable 1V  Result Date: 10/01/2022 CLINICAL DATA:  Encounter for imaging study to confirm nasogastric tube placement. Abdominal pain and hematuria. EXAM: PORTABLE ABDOMEN - 1 VIEW COMPARISON:  CT earlier today. FINDINGS: Tip of the enteric tube is below the diaphragm in the stomach, the side port is in the region of the gastroesophageal junction. Recommend advancement of at least 6 cm for optimal placement. There is gaseous gastric distension. Gaseous distention of small bowel in the central abdomen. Free air on CT earlier today is not well demonstrated on these portable exams. IMPRESSION: 1. Tip of the enteric tube below the diaphragm in the stomach, side-port in the region of the gastroesophageal junction. Recommend advancement of at least 6 cm for optimal placement. 2. Gaseous gastric and small bowel distention. Electronically Signed   By: Keith Rake M.D.   On: 10/01/2022 17:46   CT ABDOMEN PELVIS W CONTRAST  Result Date: 10/01/2022 CLINICAL DATA:  Perforated sigmoid diverticulitis. EXAM: CT ABDOMEN AND PELVIS WITH CONTRAST TECHNIQUE: Multidetector CT imaging of the abdomen and pelvis was performed using the standard protocol following bolus administration of intravenous contrast. RADIATION DOSE REDUCTION: This exam was performed according to the departmental dose-optimization program which includes automated exposure control, adjustment of the mA and/or kV according to patient size and/or use of iterative reconstruction technique. CONTRAST:  19m OMNIPAQUE IOHEXOL 350 MG/ML SOLN COMPARISON:  CT abdomen pelvis dated 09/27/2022. FINDINGS: Lower chest: The visualized lung bases are clear. There  is a small pneumoperitoneum. No free fluid. Hepatobiliary: The liver is unremarkable. No biliary dilatation. The gallbladder is unremarkable. Pancreas: Unremarkable. No pancreatic ductal dilatation or surrounding inflammatory changes. Spleen:  Normal in size without focal abnormality. Adrenals/Urinary Tract: The adrenal glands unremarkable. The kidneys, visualized ureters, and urinary bladder appear unremarkable. Stomach/Bowel: There is sigmoid diverticulitis. There is progression of inflammatory changes and increase in the amount of extraluminal air compared to the prior CT. No drainable fluid collection or abscess. Mildly dilated loops of small bowel in the upper abdomen measure up to 4 cm, likely ileus. Developing obstruction is less likely but not excluded. Clinical correlation is recommended. Appendectomy. Vascular/Lymphatic: The abdominal aorta and IVC are unremarkable. No portal venous gas. There is no adenopathy. Reproductive: The prostate and seminal vesicles are grossly unremarkable. No pelvic mass. Other: Small fat containing left inguinal hernia. Small fat containing umbilical hernia. Musculoskeletal: No acute or significant osseous findings. IMPRESSION: 1. Perforated sigmoid diverticulitis with progression of inflammatory changes and increase in the amount of extraluminal air compared to the prior CT. No drainable fluid collection or abscess. 2. Mildly dilated loops of small bowel in the upper abdomen, likely ileus. Developing obstruction is less likely but not excluded. Electronically Signed   By: Anner Crete M.D.   On: 10/01/2022 00:59    Assessment: s/p Procedure(s): EXPLORATORY LAPAROTOMY COLON RESECTION SIGMOID COLOSTOMY Patient Active Problem List   Diagnosis Date Noted   Diverticulitis 09/27/2022   Asthma 07/13/2017   Allergic rhinitis 07/13/2017    Expected post op course  Plan: d/c foley Ambulate with assistance Increase IVF's to help with hydration and fluid losses  Wean O2 as tolerated Cont PCA and NGT   LOS: 5 days     .Rosario Adie, [facility], [place]    10/02/2022 10:23 AM

## 2022-10-02 NOTE — Plan of Care (Signed)

## 2022-10-03 LAB — BASIC METABOLIC PANEL
Anion gap: 13 (ref 5–15)
BUN: 11 mg/dL (ref 6–20)
CO2: 21 mmol/L — ABNORMAL LOW (ref 22–32)
Calcium: 8.5 mg/dL — ABNORMAL LOW (ref 8.9–10.3)
Chloride: 101 mmol/L (ref 98–111)
Creatinine, Ser: 0.99 mg/dL (ref 0.61–1.24)
GFR, Estimated: >60 mL/min (ref >60)
Glucose, Bld: 103 mg/dL — ABNORMAL HIGH (ref 70–99)
Potassium: 3.7 mmol/L (ref 3.5–5.1)
Sodium: 135 mmol/L (ref 135–145)

## 2022-10-03 LAB — CBC
HCT: 37.1 % — ABNORMAL LOW (ref 39.0–52.0)
Hemoglobin: 12.6 g/dL — ABNORMAL LOW (ref 13.0–17.0)
MCH: 31.2 pg (ref 26.0–34.0)
MCHC: 34 g/dL (ref 30.0–36.0)
MCV: 91.8 fL (ref 80.0–100.0)
Platelets: 344 10*3/uL (ref 150–400)
RBC: 4.04 MIL/uL — ABNORMAL LOW (ref 4.22–5.81)
RDW: 12.4 % (ref 11.5–15.5)
WBC: 9.6 10*3/uL (ref 4.0–10.5)
nRBC: 0 % (ref 0.0–0.2)

## 2022-10-03 MED ORDER — KCL IN DEXTROSE-NACL 20-5-0.45 MEQ/L-%-% IV SOLN
INTRAVENOUS | Status: DC
  Filled 2022-10-03 (×4): qty 1000

## 2022-10-03 MED ORDER — ACETAMINOPHEN 10 MG/ML IV SOLN
1000.0000 mg | Freq: Four times a day (QID) | INTRAVENOUS | Status: AC
  Administered 2022-10-03 – 2022-10-04 (×4): 1000 mg via INTRAVENOUS
  Filled 2022-10-03 (×3): qty 100

## 2022-10-03 NOTE — Plan of Care (Signed)

## 2022-10-03 NOTE — Progress Notes (Signed)
Mobility Specialist - Progress Note   10/03/22 1300  Mobility  Activity Ambulated independently in hallway  Level of Assistance Independent  Assistive Device None  Distance Ambulated (ft) 1100 ft  Activity Response Tolerated well  Mobility Referral Yes      Pt received in bed agreeable to mobility. Began session w/ RW, was able to progress and ambulate independently without AD. No complaints throughout, tolerated increased distance well. Left in bed w/ family at his side.   [place] Specialist Please contact via SecureChat or Rehab office at [phone]

## 2022-10-03 NOTE — Progress Notes (Addendum)
2 Days Post-Op Exlap, Hartman's  Subjective: Pain controlled with PCA, UOP better, NG functioning, no bowel function yet  Objective: Vital signs in last 24 hours: Temp:  [97.5 F (36.4 C)-97.9 F (36.6 C)] 97.8 F (36.6 C) (02/25 0759) Pulse Rate:  [87-103] 93 (02/25 0759) Resp:  [17-19] 18 (02/25 0759) BP: (136-161)/(83-94) 148/90 (02/25 0759) SpO2:  [94 %-98 %] 96 % (02/25 0759)   Intake/Output from previous day: 02/24 0701 - 02/25 0700 In: -  Out: 2000 [Emesis/NG output:2000] Intake/Output this shift: No intake/output data recorded.   General appearance: alert and cooperative GI: soft, appropriately TTP, distended Colostomy: beefy red, no air or stool in bag Incision: no significant drainage  Lab Results:  Recent Labs    10/02/22 0256 10/03/22 0750  WBC 13.1* 9.6  HGB 13.9 12.6*  HCT 41.5 37.1*  PLT 361 344    BMET Recent Labs    10/02/22 0256 10/03/22 0750  NA 134* 135  K 3.8 3.7  CL 98 101  CO2 25 21*  GLUCOSE 133* 103*  BUN 11 11  CREATININE 0.92 0.99  CALCIUM 8.5* 8.5*    PT/INR No results for input(s): "LABPROT", "INR" in the last 72 hours. ABG No results for input(s): "PHART", "HCO3" in the last 72 hours.  Invalid input(s): "PCO2", "PO2"  MEDS, Scheduled  enoxaparin (LOVENOX) injection  40 mg Subcutaneous Q24H   HYDROmorphone   Intravenous Q4H    Studies/Results: DG Abd Portable 1V  Result Date: 10/01/2022 CLINICAL DATA:  Small-bowel obstruction EXAM: PORTABLE ABDOMEN - 1 VIEW COMPARISON:  3:51 p.m. FINDINGS: Multiple gas-filled loops of small bowel are again seen throughout the visualized upper and mid abdomen in keeping with changes of a distal small bowel obstruction. Nasogastric tube is seen with its tip within the proximal body of the stomach. No free intraperitoneal gas identified. IMPRESSION: 1. Nasogastric tube tip within the proximal body of the stomach. 2. Persistent distal small bowel obstruction. Electronically Signed   By:  Fidela Salisbury M.D.   On: 10/01/2022 21:38   DG Abd Portable 1V  Result Date: 10/01/2022 CLINICAL DATA:  Encounter for imaging study to confirm nasogastric tube placement. Abdominal pain and hematuria. EXAM: PORTABLE ABDOMEN - 1 VIEW COMPARISON:  CT earlier today. FINDINGS: Tip of the enteric tube is below the diaphragm in the stomach, the side port is in the region of the gastroesophageal junction. Recommend advancement of at least 6 cm for optimal placement. There is gaseous gastric distension. Gaseous distention of small bowel in the central abdomen. Free air on CT earlier today is not well demonstrated on these portable exams. IMPRESSION: 1. Tip of the enteric tube below the diaphragm in the stomach, side-port in the region of the gastroesophageal junction. Recommend advancement of at least 6 cm for optimal placement. 2. Gaseous gastric and small bowel distention. Electronically Signed   By: Keith Rake M.D.   On: 10/01/2022 17:46    Assessment: s/p Procedure(s): EXPLORATORY LAPAROTOMY COLON RESECTION SIGMOID COLOSTOMY Patient Active Problem List   Diagnosis Date Noted   Diverticulitis 09/27/2022   Asthma 07/13/2017   Allergic rhinitis 07/13/2017    Expected post op course  Plan: Ambulate in hall with assistance Maintenance IVF's Cont PCA and NGT until ileus resolves ID: Cont Zosyn for 5 days (2/28) Ostomy education   LOS: 6 days     .Rosario Adie, [facility], [place]    10/03/2022 9:13 AM

## 2022-10-04 LAB — CBC
HCT: 39.1 % (ref 39.0–52.0)
Hemoglobin: 13.3 g/dL (ref 13.0–17.0)
MCH: 31 pg (ref 26.0–34.0)
MCHC: 34 g/dL (ref 30.0–36.0)
MCV: 91.1 fL (ref 80.0–100.0)
Platelets: 410 10*3/uL — ABNORMAL HIGH (ref 150–400)
RBC: 4.29 MIL/uL (ref 4.22–5.81)
RDW: 12.2 % (ref 11.5–15.5)
WBC: 9.5 10*3/uL (ref 4.0–10.5)
nRBC: 0 % (ref 0.0–0.2)

## 2022-10-04 LAB — BASIC METABOLIC PANEL
Anion gap: 12 (ref 5–15)
BUN: 9 mg/dL (ref 6–20)
CO2: 21 mmol/L — ABNORMAL LOW (ref 22–32)
Calcium: 8.8 mg/dL — ABNORMAL LOW (ref 8.9–10.3)
Chloride: 99 mmol/L (ref 98–111)
Creatinine, Ser: 0.83 mg/dL (ref 0.61–1.24)
GFR, Estimated: >60 mL/min (ref >60)
Glucose, Bld: 114 mg/dL — ABNORMAL HIGH (ref 70–99)
Potassium: 4 mmol/L (ref 3.5–5.1)
Sodium: 132 mmol/L — ABNORMAL LOW (ref 135–145)

## 2022-10-04 LAB — SURGICAL PATHOLOGY

## 2022-10-04 MED ORDER — MUPIROCIN CALCIUM 2 % EX CREA
TOPICAL_CREAM | Freq: Two times a day (BID) | CUTANEOUS | Status: DC
  Filled 2022-10-04: qty 15

## 2022-10-04 MED ORDER — KETOROLAC TROMETHAMINE 30 MG/ML IJ SOLN
30.0000 mg | Freq: Three times a day (TID) | INTRAMUSCULAR | Status: AC
  Administered 2022-10-04 – 2022-10-07 (×9): 30 mg via INTRAVENOUS
  Filled 2022-10-04 (×9): qty 1

## 2022-10-04 MED ORDER — ACETAMINOPHEN 500 MG PO TABS
1000.0000 mg | ORAL_TABLET | Freq: Four times a day (QID) | ORAL | Status: DC
  Administered 2022-10-04 – 2022-10-08 (×12): 1000 mg via ORAL
  Filled 2022-10-04 (×15): qty 2

## 2022-10-04 NOTE — Consult Note (Signed)
[place] Nurse ostomy consult note Stoma type/location: LLQ, end colostomy  Stomal assessment/size: 1 5/8" slightly oblong, flush with the skin, pink, moist Peristomal assessment:  intact  Treatment options for stomal/peristomal skin: 2" skin barrier ring Output brown; scant liquid Ostomy pouching: 2pc. 2 3/4" with 2" skin barrier ring Education provided:  Explained role of ostomy nurse and creation of stoma  Explained stoma characteristics (budded, flush, color, texture, care) Demonstrated pouch change (cutting new skin barrier, measuring stoma, cleaning peristomal skin and stoma, use of barrier ring) Education on emptying when 1/3 to 1/2 full and how to empty Demonstrated use of wick to clean spout  Discussed bathing, diet, gas Discussed treatment of peristomal skin (ostomy powder, skin barrier wipes)  Enrolled patient in [place] program: Yes  Plan to see patient again with GF on Wednesday, both are very engaged to learn care.   [place] Nurse will follow along with you for continued support with ostomy teaching and care Starr MSN, RN, [place], [place], [place]

## 2022-10-04 NOTE — Progress Notes (Signed)
Initial Nutrition Assessment  DOCUMENTATION CODES:   Obesity unspecified  INTERVENTION:  Monitor for diet advancement/tolerance Recommend TPN if unable to advance diet "Colostomy Nutrition Therapy" handout provided and discussed with patient and family  NUTRITION DIAGNOSIS:   Inadequate oral intake related to acute illness, altered GI function as evidenced by NPO status.  GOAL:   Patient will meet greater than or equal to 90% of their needs  MONITOR:   Diet advancement, Labs, I & O's  REASON FOR ASSESSMENT:   NPO/Clear Liquid Diet    ASSESSMENT:   Pt admitted with LLQ abdominal pain 2/2 sigmoid diverticulitis. No significant PMH on file.   02/23: s/p exlap, sigmoid partial colectomy with end colostomy 02/24: NGT clamping today; NPO x sips of clears from the floor  Spoke with pt and his family at bedside. He states that he was eating great and at his baseline up until a week ago Saturday, just prior to admission. He endorses having some abdominal pain but this did not hinder his PO intake.    Pt states that within the last week, his weight has declined from 170 lbs to 140 lbs. Prior to this his weight was stable. Unfortunately, there is limited documentation of weight history on file to review.   Medications reviewed  IV drips: D5 and NaCl with KCl @ 56m/hr, abx  Labs: sodium 132, Ca 8.8  NGT to LIS: 5532mx24 hours + 50021m12 hours  NUTRITION - FOCUSED PHYSICAL EXAM:  Flowsheet Row Most Recent Value  Orbital Region No depletion  Upper Arm Region No depletion  Thoracic and Lumbar Region No depletion  Buccal Region No depletion  Temple Region No depletion  Clavicle Bone Region No depletion  Clavicle and Acromion Bone Region No depletion  Scapular Bone Region No depletion  Dorsal Hand No depletion  Patellar Region No depletion  Anterior Thigh Region No depletion  Posterior Calf Region No depletion  Edema (RD Assessment) None  Hair Reviewed  Eyes Reviewed   Mouth Reviewed  Skin Reviewed  Nails Reviewed       Diet Order:   Diet Order             Diet clear liquid Fluid consistency: Thin  Diet effective now                   EDUCATION NEEDS:   Education needs have been addressed  Skin:  Skin Assessment: Reviewed RN Assessment (abdominal incision (colostomy))  Last BM:  2/22  Height:   Ht Readings from Last 1 Encounters:  10/01/22 '6\' 1"'$  (1.854 m)    Weight:   Wt Readings from Last 1 Encounters:  10/01/22 120.2 kg    Ideal Body Weight:  83.6 kg  BMI:  Body mass index is 34.96 kg/m.  Estimated Nutritional Needs:   Kcal:  2200-2400  Protein:  110-125g  Fluid:  >/=2L  [place] DanaDN, LDN Clinical Nutrition

## 2022-10-04 NOTE — Progress Notes (Signed)
[facility] Progress Note  3 Days Post-Op  Subjective: Feeling better today.  Colostomy starting to work.  Mobilizing well.  Pain is improving. C/o impetigo erupting on face  Objective: Vital signs in last 24 hours: Temp:  [97.8 F (36.6 C)-98.3 F (36.8 C)] 97.8 F (36.6 C) (02/26 0814) Pulse Rate:  [74-89] 77 (02/26 0814) Resp:  [16-18] 16 (02/26 0814) BP: (139-151)/(84-95) 150/88 (02/26 0814) SpO2:  [95 %-98 %] 98 % (02/26 0814) Last BM Date : 09/30/22  Intake/Output from previous day: 02/25 0701 - 02/26 0700 In: 1109.9 [I.V.:560; IV Piggyback:549.9] Out: 1300 [Urine:750; Emesis/NG output:550] Intake/Output this shift: Total I/O In: 1138.8 [I.V.:1138.8] Out: -   PE: Gen:  Alert, NAD, pleasant Face - patches c/w impetigo around mouth and nose Abd: soft, less distended, some BS present, NGT with some bilious output, but also watered down from ice/water.  Colostomy with air and liquid stool starting to drain.  Stoma is pink and viable.  Midline wound is clean and packed  Lab Results:  Recent Labs    10/03/22 0750 10/04/22 0215  WBC 9.6 9.5  HGB 12.6* 13.3  HCT 37.1* 39.1  PLT 344 410*   BMET Recent Labs    10/03/22 0750 10/04/22 0215  NA 135 132*  K 3.7 4.0  CL 101 99  CO2 21* 21*  GLUCOSE 103* 114*  BUN 11 9  CREATININE 0.99 0.83  CALCIUM 8.5* 8.8*   PT/INR No results for input(s): "LABPROT", "INR" in the last 72 hours. CMP     Component Value Date/Time   NA 132 (L) 10/04/2022 0215   K 4.0 10/04/2022 0215   CL 99 10/04/2022 0215   CO2 21 (L) 10/04/2022 0215   GLUCOSE 114 (H) 10/04/2022 0215   BUN 9 10/04/2022 0215   CREATININE 0.83 10/04/2022 0215   CALCIUM 8.8 (L) 10/04/2022 0215   PROT 7.7 09/27/2022 1758   ALBUMIN 3.8 09/27/2022 1758   AST 20 09/27/2022 1758   ALT 34 09/27/2022 1758   ALKPHOS 80 09/27/2022 1758   BILITOT 1.2 09/27/2022 1758   GFRNONAA >60 10/04/2022 0215   Lipase     Component Value Date/Time   LIPASE  33 09/27/2022 1758       Studies/Results: No results found.  Anti-infectives: Anti-infectives (From admission, onward)    Start     Dose/Rate Route Frequency Ordered Stop   09/28/22 1000  terbinafine (LAMISIL) tablet 250 mg  Status:  Discontinued       Note to Pharmacy: Take with food     250 mg Oral Daily 09/27/22 2356 10/01/22 1617   09/28/22 0600  piperacillin-tazobactam (ZOSYN) IVPB 3.375 g  Status:  Discontinued        3.375 g 12.5 mL/hr over 240 Minutes Intravenous Every 8 hours 09/27/22 2244 09/27/22 2356   09/28/22 0015  piperacillin-tazobactam (ZOSYN) IVPB 4.5 g  Status:  Discontinued        4.5 g 200 mL/hr over 30 Minutes Intravenous Every 6 hours 09/28/22 0011 09/28/22 0012   09/28/22 0015  piperacillin-tazobactam (ZOSYN) IVPB 3.375 g        3.375 g 12.5 mL/hr over 240 Minutes Intravenous Every 8 hours 09/28/22 0012 10/06/22 2359   09/27/22 2215  metroNIDAZOLE (FLAGYL) IVPB 500 mg  Status:  Discontinued        500 mg 100 mL/hr over 60 Minutes Intravenous Every 8 hours 09/27/22 2206 09/27/22 2234   09/27/22 2215  ciprofloxacin (CIPRO) IVPB 400 mg  Status:  Discontinued        400 mg 200 mL/hr over 60 Minutes Intravenous Every 12 hours 09/27/22 2206 09/27/22 2244        Assessment/Plan POD 3, s/p Hartmann's by Dr. Kae Heller 2/23 for Sigmoid diverticulitis with contained perforation without abscess -multi-modal pain control -AF and WBC normal -clamp NGT today and allow sips of clears from the floor -mobilize, pulm toilet -zosyn  FEN: NPO x sips of clears, NGT clamped, IVFs ID: [email or web address] VTE: lovenox   Eczema dermatitis Asthma Impetigo - mupirocin ointment BID x 5 days  I reviewed last 24 h vitals and pain scores, last 48 h intake and output, and last 24 h labs and trends.    LOS: 7 days    George Rodriguez, [facility] 10/04/2022, 9:15 AM Please see Amion for pager number during day hours 7:00am-4:30pm

## 2022-10-04 NOTE — TOC Initial Note (Addendum)
Transition of Care (TOC) - Initial/Assessment Note   Spoke to patient at bedside. Updated face sheet information patient recently moved to [place].   Discussed home health RN. Patient aware he and his support person (significant other) will be taught wound care and ostomy care prior to discharge . He has received information from [facility] nurse on ostomy supplies.   HHRN will not be able to make daily visits, but will check on ostomy and wound.   Discharge nurse will provide some dressing supplies [facility] will assist in ordering also) and ostomy supplies.   Malachy Mood with [facility] unable to accept referral.   Hoyle Sauer with [facility] unable to accept referral.   Dianna with [facility] requested NCM to fax her a face sheet at [phone] she will "run insurance" to see if she can accept. Await determination.   Beverlee Nims with [facility] has accepted referral for home health RN   Faxed information to her. She will need to be called when patient is discharged [phone]   PA ordered referral to ostomy clinic   Patient Details  Name: George Rodriguez MRN: [id number] Date of Birth: [birth year]  Transition of Care [facility]) CM/SW Contact:    Marilu Favre, RN Phone Number: 10/04/2022, 2:15 PM  Clinical Narrative:                   Expected Discharge Plan: [place] Barriers to Discharge: Continued Medical Work up   Patient Goals and CMS Choice Patient states their goals for this hospitalization and ongoing recovery are:: to return to home CMS Medicare.gov Compare Post Acute Care list provided to:: Patient Choice offered to / list presented to : Patient [place] ownership interest in [facility].provided to:: Patient    Expected Discharge Plan and Services   Discharge Planning Services: CM Consult Post Acute Care Choice: [place] arrangements for the past 2 months: Single Family Home                 DME Arranged: N/A DME Agency: NA       HH  Arranged: RN (see note)          Prior Living Arrangements/Services Living arrangements for the past 2 months: Single Family Home Lives with:: Significant Other Patient language and need for interpreter reviewed:: Yes Do you feel safe going back to the place where you live?: Yes      Need for Family Participation in Patient Care: Yes (Comment) Care giver support system in place?: Yes (comment)   Criminal Activity/Legal Involvement Pertinent to Current Situation/Hospitalization: No - Comment as needed  Activities of Daily Living Home Assistive Devices/Equipment: None ADL Screening (condition at time of admission) Patient's cognitive ability adequate to safely complete daily activities?: Yes Is the patient deaf or have difficulty hearing?: No Does the patient have difficulty seeing, even when wearing glasses/contacts?: No Does the patient have difficulty concentrating, remembering, or making decisions?: No Patient able to express need for assistance with ADLs?: No Does the patient have difficulty dressing or bathing?: No Independently performs ADLs?: No Communication: Independent Dressing (OT): Independent Grooming: Independent Feeding: Independent Bathing: Independent Toileting: Independent Walks in Home: Independent Does the patient have difficulty walking or climbing stairs?: No Weakness of Legs: None Weakness of Arms/Hands: None  Permission Sought/Granted   Permission granted to share information with : No              Emotional Assessment Appearance:: Appears stated age  Attitude/Demeanor/Rapport: Engaged Affect (typically observed): Accepting Orientation: : Oriented to Self, Oriented to Place, Oriented to  Time, Oriented to Situation Alcohol / Substance Use: Not Applicable Psych Involvement: No (comment)  Admission diagnosis:  Diverticulitis [K57.92] Sigmoid diverticulitis [K57.32] Patient Active Problem List   Diagnosis Date Noted   Diverticulitis  09/27/2022   Asthma 07/13/2017   Allergic rhinitis 07/13/2017   PCP:  Valerie Roys, DO Pharmacy:   [id number] [id number]: [phone]: [phone] [address] [id number]: [phone]: [phone] [id number] [address]: [phone]: [phone]   Social Determinants of Health (SDOH) Social History: [place]  (09/28/2022)  Transportation Needs: No Transportation Needs (09/28/2022)  Utilities: Not At Risk (09/28/2022)  Tobacco Use: Medium Risk (10/02/2022)   SDOH Interventions:     Readmission Risk Interventions     No data to display

## 2022-10-05 LAB — CBC
HCT: 38 % — ABNORMAL LOW (ref 39.0–52.0)
Hemoglobin: 12.9 g/dL — ABNORMAL LOW (ref 13.0–17.0)
MCH: 30.9 pg (ref 26.0–34.0)
MCHC: 33.9 g/dL (ref 30.0–36.0)
MCV: 90.9 fL (ref 80.0–100.0)
Platelets: 414 10*3/uL — ABNORMAL HIGH (ref 150–400)
RBC: 4.18 MIL/uL — ABNORMAL LOW (ref 4.22–5.81)
RDW: 12.2 % (ref 11.5–15.5)
WBC: 9.1 10*3/uL (ref 4.0–10.5)
nRBC: 0 % (ref 0.0–0.2)

## 2022-10-05 LAB — BASIC METABOLIC PANEL
Anion gap: 12 (ref 5–15)
BUN: 9 mg/dL (ref 6–20)
CO2: 22 mmol/L (ref 22–32)
Calcium: 8.8 mg/dL — ABNORMAL LOW (ref 8.9–10.3)
Chloride: 100 mmol/L (ref 98–111)
Creatinine, Ser: 0.93 mg/dL (ref 0.61–1.24)
GFR, Estimated: >60 mL/min (ref >60)
Glucose, Bld: 104 mg/dL — ABNORMAL HIGH (ref 70–99)
Potassium: 3.9 mmol/L (ref 3.5–5.1)
Sodium: 134 mmol/L — ABNORMAL LOW (ref 135–145)

## 2022-10-05 LAB — MAGNESIUM: Magnesium: 2.2 mg/dL (ref 1.7–2.4)

## 2022-10-05 LAB — PHOSPHORUS: Phosphorus: 3.4 mg/dL (ref 2.5–4.6)

## 2022-10-05 MED ORDER — HYDROMORPHONE HCL 1 MG/ML IJ SOLN
0.5000 mg | INTRAMUSCULAR | Status: DC | PRN
  Administered 2022-10-05 – 2022-10-08 (×13): 0.5 mg via INTRAVENOUS
  Filled 2022-10-05 (×13): qty 0.5

## 2022-10-05 MED ORDER — METHOCARBAMOL 500 MG PO TABS
500.0000 mg | ORAL_TABLET | Freq: Three times a day (TID) | ORAL | Status: DC
  Administered 2022-10-05 – 2022-10-08 (×10): 500 mg via ORAL
  Filled 2022-10-05 (×10): qty 1

## 2022-10-05 MED ORDER — OXYCODONE HCL 5 MG PO TABS
5.0000 mg | ORAL_TABLET | ORAL | Status: DC | PRN
  Administered 2022-10-05 (×2): 10 mg via ORAL
  Administered 2022-10-05: 5 mg via ORAL
  Administered 2022-10-06 – 2022-10-08 (×10): 10 mg via ORAL
  Filled 2022-10-05 (×12): qty 2
  Filled 2022-10-05: qty 1

## 2022-10-05 MED ORDER — ONDANSETRON HCL 4 MG/2ML IJ SOLN
4.0000 mg | INTRAMUSCULAR | Status: DC | PRN
  Administered 2022-10-05 – 2022-10-06 (×4): 4 mg via INTRAVENOUS
  Filled 2022-10-05 (×4): qty 2

## 2022-10-05 NOTE — TOC Progression Note (Addendum)
Transition of Care (TOC) - Progression Note   Possible discharge home tomorrow updated [facility]   They will need to be called  ( [phone] ) on day of discharge and discharge summary and new WOC note  faxed to them Fax [phone]  Patient Details  Name: George Rodriguez MRN: [id number] Date of Birth: 04-10-1991  Transition of Care [facility]) CM/SW Contact  Marilu Favre, RN Phone Number: 10/05/2022, 11:36 AM  Clinical Narrative:       Expected Discharge Plan: [place] Barriers to Discharge: Continued Medical Work up  Expected Discharge Plan and Services   Discharge Planning Services: CM Consult Post Acute Care Choice: [place] arrangements for the past 2 months: Single Family Home                 DME Arranged: N/A DME Agency: NA       HH Arranged: RN (see note)           Social Determinants of Health (SDOH) Interventions [place]: Low Risk  (09/28/2022)  Transportation Needs: No Transportation Needs (09/28/2022)  Utilities: Not At Risk (09/28/2022)  Tobacco Use: Medium Risk (10/02/2022)    Readmission Risk Interventions     No data to display

## 2022-10-05 NOTE — Progress Notes (Signed)
[facility] Progress Note  4 Days Post-Op  Subjective: Feeling well today.  Mobilized well yesterday.  Tolerating CLD with no issues.  Ready to get PCA stuff off  Objective: Vital signs in last 24 hours: Temp:  [97.8 F (36.6 C)-98.7 F (37.1 C)] 98.3 F (36.8 C) (02/27 0756) Pulse Rate:  [80-84] 81 (02/27 0756) Resp:  [14-18] 18 (02/27 0756) BP: (114-145)/(75-92) 123/77 (02/27 0756) SpO2:  [95 %-98 %] 95 % (02/27 0756) Last BM Date : 09/30/22  Intake/Output from previous day: 02/26 0701 - 02/27 0700 In: 1779.2 [I.V.:1620.5; IV Piggyback:158.8] Out: 1250 [Urine:500; Emesis/NG output:750] Intake/Output this shift: No intake/output data recorded.  PE: Gen:  Alert, NAD, pleasant Face - patches c/w impetigo around mouth and nose, improving Abd: soft, less distended, some BS present, Colostomy with air and liquid stool starting to drain.  Stoma is pink and viable.  Midline wound is clean and packed  Lab Results:  Recent Labs    10/04/22 0215 10/05/22 0255  WBC 9.5 9.1  HGB 13.3 12.9*  HCT 39.1 38.0*  PLT 410* 414*   BMET Recent Labs    10/04/22 0215 10/05/22 0255  NA 132* 134*  K 4.0 3.9  CL 99 100  CO2 21* 22  GLUCOSE 114* 104*  BUN 9 9  CREATININE 0.83 0.93  CALCIUM 8.8* 8.8*   PT/INR No results for input(s): "LABPROT", "INR" in the last 72 hours. CMP     Component Value Date/Time   NA 134 (L) 10/05/2022 0255   K 3.9 10/05/2022 0255   CL 100 10/05/2022 0255   CO2 22 10/05/2022 0255   GLUCOSE 104 (H) 10/05/2022 0255   BUN 9 10/05/2022 0255   CREATININE 0.93 10/05/2022 0255   CALCIUM 8.8 (L) 10/05/2022 0255   PROT 7.7 09/27/2022 1758   ALBUMIN 3.8 09/27/2022 1758   AST 20 09/27/2022 1758   ALT 34 09/27/2022 1758   ALKPHOS 80 09/27/2022 1758   BILITOT 1.2 09/27/2022 1758   GFRNONAA >60 10/05/2022 0255   Lipase     Component Value Date/Time   LIPASE 33 09/27/2022 1758       Studies/Results: No results  found.  Anti-infectives: Anti-infectives (From admission, onward)    Start     Dose/Rate Route Frequency Ordered Stop   09/28/22 1000  terbinafine (LAMISIL) tablet 250 mg  Status:  Discontinued       Note to Pharmacy: Take with food     250 mg Oral Daily 09/27/22 2356 10/01/22 1617   09/28/22 0600  piperacillin-tazobactam (ZOSYN) IVPB 3.375 g  Status:  Discontinued        3.375 g 12.5 mL/hr over 240 Minutes Intravenous Every 8 hours 09/27/22 2244 09/27/22 2356   09/28/22 0015  piperacillin-tazobactam (ZOSYN) IVPB 4.5 g  Status:  Discontinued        4.5 g 200 mL/hr over 30 Minutes Intravenous Every 6 hours 09/28/22 0011 09/28/22 0012   09/28/22 0015  piperacillin-tazobactam (ZOSYN) IVPB 3.375 g        3.375 g 12.5 mL/hr over 240 Minutes Intravenous Every 8 hours 09/28/22 0012 10/06/22 2359   09/27/22 2215  metroNIDAZOLE (FLAGYL) IVPB 500 mg  Status:  Discontinued        500 mg 100 mL/hr over 60 Minutes Intravenous Every 8 hours 09/27/22 2206 09/27/22 2234   09/27/22 2215  ciprofloxacin (CIPRO) IVPB 400 mg  Status:  Discontinued        400 mg 200 mL/hr over 60 Minutes Intravenous  Every 12 hours 09/27/22 2206 09/27/22 2244        Assessment/Plan POD 4, s/p Hartmann's by Dr. Kae Heller 2/23 for Sigmoid diverticulitis with contained perforation without abscess -multi-modal pain control, DC PCA today -AF and WBC normal -Adv to FLD today and likely to soft diet in am if tolerates well today, order already in place -mobilize, pulm toilet -zosyn to complete tomorrow, otherwise SLIV -anticipate home tomorrow if continues to do well today.  FEN: FLD, Adv to soft in am, SLIV ID: [email or web address] to complete 2/28 VTE: lovenox   Eczema dermatitis Asthma Impetigo - mupirocin ointment BID x 5 days  I reviewed last 24 h vitals and pain scores, last 48 h intake and output, and last 24 h labs and trends.    LOS: 8 days    George Rodriguez, [facility] 10/05/2022, 8:56 AM Please see  Amion for pager number during day hours 7:00am-4:30pm

## 2022-10-06 LAB — BASIC METABOLIC PANEL
Anion gap: 13 (ref 5–15)
BUN: 13 mg/dL (ref 6–20)
CO2: 22 mmol/L (ref 22–32)
Calcium: 9.1 mg/dL (ref 8.9–10.3)
Chloride: 101 mmol/L (ref 98–111)
Creatinine, Ser: 0.92 mg/dL (ref 0.61–1.24)
GFR, Estimated: >60 mL/min (ref >60)
Glucose, Bld: 107 mg/dL — ABNORMAL HIGH (ref 70–99)
Potassium: 3.9 mmol/L (ref 3.5–5.1)
Sodium: 136 mmol/L (ref 135–145)

## 2022-10-06 LAB — CBC
HCT: 39 % (ref 39.0–52.0)
Hemoglobin: 13.6 g/dL (ref 13.0–17.0)
MCH: 31.3 pg (ref 26.0–34.0)
MCHC: 34.9 g/dL (ref 30.0–36.0)
MCV: 89.9 fL (ref 80.0–100.0)
Platelets: 490 10*3/uL — ABNORMAL HIGH (ref 150–400)
RBC: 4.34 MIL/uL (ref 4.22–5.81)
RDW: 12 % (ref 11.5–15.5)
WBC: 11.8 10*3/uL — ABNORMAL HIGH (ref 4.0–10.5)
nRBC: 0 % (ref 0.0–0.2)

## 2022-10-06 MED ORDER — POTASSIUM CHLORIDE 2 MEQ/ML IV SOLN
INTRAVENOUS | Status: DC
  Filled 2022-10-06 (×4): qty 1000

## 2022-10-06 NOTE — Care Plan (Signed)
Wound care completed to abdominal wound.  17x5x24m edges well defined. Nonmalodorus, no drainage noted.  Tissue is pink/red. Patient tolerated well without complaints.  NADN. Sreece, RN

## 2022-10-06 NOTE — Progress Notes (Signed)
[facility] Progress Note  5 Days Post-Op  Subjective: Nauseated.  Vomited once overnight, bilious.  None further since that time, but still taking zofran.  Ambulating.  Bag still working very well.  Objective: Vital signs in last 24 hours: Temp:  [97.9 F (36.6 C)-98.3 F (36.8 C)] 98.2 F (36.8 C) (02/28 0754) Pulse Rate:  [78-89] 89 (02/28 0754) Resp:  [16-18] 16 (02/28 0754) BP: (144-153)/(75-96) 144/96 (02/28 0754) SpO2:  [94 %-98 %] 94 % (02/28 0754) Last BM Date : 09/30/22  Intake/Output from previous day: 02/27 0701 - 02/28 0700 In: 331.2 [P.O.:240; IV Piggyback:91.2] Out: 50 [Stool:50] Intake/Output this shift: Total I/O In: -  Out: 50 [Stool:50]  PE: Gen:  Alert, NAD, pleasant Abd: soft, slight increase in distention, some BS present, Colostomy with air and liquid stool in his pouch.  Stoma is pink and viable.  Midline wound is clean and packed  Lab Results:  Recent Labs    10/05/22 0255 10/06/22 0102  WBC 9.1 11.8*  HGB 12.9* 13.6  HCT 38.0* 39.0  PLT 414* 490*   BMET Recent Labs    10/05/22 0255 10/06/22 0102  NA 134* 136  K 3.9 3.9  CL 100 101  CO2 22 22  GLUCOSE 104* 107*  BUN 9 13  CREATININE 0.93 0.92  CALCIUM 8.8* 9.1   PT/INR No results for input(s): "LABPROT", "INR" in the last 72 hours. CMP     Component Value Date/Time   NA 136 10/06/2022 0102   K 3.9 10/06/2022 0102   CL 101 10/06/2022 0102   CO2 22 10/06/2022 0102   GLUCOSE 107 (H) 10/06/2022 0102   BUN 13 10/06/2022 0102   CREATININE 0.92 10/06/2022 0102   CALCIUM 9.1 10/06/2022 0102   PROT 7.7 09/27/2022 1758   ALBUMIN 3.8 09/27/2022 1758   AST 20 09/27/2022 1758   ALT 34 09/27/2022 1758   ALKPHOS 80 09/27/2022 1758   BILITOT 1.2 09/27/2022 1758   GFRNONAA >60 10/06/2022 0102   Lipase     Component Value Date/Time   LIPASE 33 09/27/2022 1758       Studies/Results: No results found.  Anti-infectives: Anti-infectives (From admission, onward)     Start     Dose/Rate Route Frequency Ordered Stop   09/28/22 1000  terbinafine (LAMISIL) tablet 250 mg  Status:  Discontinued       Note to Pharmacy: Take with food     250 mg Oral Daily 09/27/22 2356 10/01/22 1617   09/28/22 0600  piperacillin-tazobactam (ZOSYN) IVPB 3.375 g  Status:  Discontinued        3.375 g 12.5 mL/hr over 240 Minutes Intravenous Every 8 hours 09/27/22 2244 09/27/22 2356   09/28/22 0015  piperacillin-tazobactam (ZOSYN) IVPB 4.5 g  Status:  Discontinued        4.5 g 200 mL/hr over 30 Minutes Intravenous Every 6 hours 09/28/22 0011 09/28/22 0012   09/28/22 0015  piperacillin-tazobactam (ZOSYN) IVPB 3.375 g        3.375 g 12.5 mL/hr over 240 Minutes Intravenous Every 8 hours 09/28/22 0012 10/06/22 2359   09/27/22 2215  metroNIDAZOLE (FLAGYL) IVPB 500 mg  Status:  Discontinued        500 mg 100 mL/hr over 60 Minutes Intravenous Every 8 hours 09/27/22 2206 09/27/22 2234   09/27/22 2215  ciprofloxacin (CIPRO) IVPB 400 mg  Status:  Discontinued        400 mg 200 mL/hr over 60 Minutes Intravenous Every 12 hours 09/27/22  2206 09/27/22 2244        Assessment/Plan POD 5, s/p Hartmann's by Dr. Kae Heller 2/23 for Sigmoid diverticulitis with contained perforation without abscess -multi-modal pain control -AF and WBC up slightly to 11.8.  monitor.  If continues to trend up tomorrow in the setting of set back with bowel function, will order a CT scan to rule out post op abscess, etc. -NPO x sips/ice -mobilize, pulm toilet -zosyn to complete today but will continue in the setting of possible increasing WBC, restart IVFs   FEN: NPO x sips/chips/restart IVFs ID: [email or web address] to complete 2/28, but continue given increasing WBC VTE: lovenox   Eczema dermatitis Asthma Impetigo - mupirocin ointment BID x 5 days  I reviewed last 24 h vitals and pain scores, last 48 h intake and output, and last 24 h labs and trends.    LOS: 9 days    Henreitta Cea, [facility] 10/06/2022, 9:53 AM Please see Amion for pager number during day hours 7:00am-4:30pm

## 2022-10-06 NOTE — Consult Note (Signed)
[facility] Nurse ostomy follow up Stoma type/location: LLQ, end colostomy  Stomal assessment/size: 1 5/6', oval shaped, pink, moist, flush Peristomal assessment: intact  Treatment options for stomal/peristomal skin: 2" skin barrier ring; patient with history of eczema, sensitive skin, treated peristomal skin with No-sting skin barrier wipe, mild contact dermatitis noted from tape and pectin barrier  Output liquid, brown, +flatus Ostomy pouching: 2pc 2 3/4" with 2" skin barrier ring  Education provided:  Girlfriend changed pouch with limited cues/assistance.  Cut new skin barrier, removed pouch, placed new barrier ring, placed new pouch with minimal guidance on placement, able to attach pouch to skin barrier. Patient and GF able to use lock and roll closure.  WOC demonstrated using wick to clean spout and how to better initiate opening of lock and roll closure.  Discussed bathing/showering, gas/muffling sound, odor issues Discussed peristomal hernia risk, provided contact information for Stealth belt, osteosecrets, hernia belts. Patient works in [employer] work understands risk and need to limit lifting.  Discussed intimacy and need to empty pouch just before intimacy  GF is very engaged to support, patient will be going to live with her post op.  (7) 2pc pouching systems and barrier rings in the room. Requested bedside nurse to provide No-Sting skin barrier wipes from floor stock for patient.  [employer] catalog provided and items marked for patient use   GF and patient question need for [facility] and we discussed roles of [place], they will discuss further and talk to [facility] about options.  GF has been learning to do wound care and feels that she could do this as well.   Enrolled patient in [place] Start Discharge program: Yes   [facility] Nurse will follow along with you for continued support with ostomy teaching and care [place] MSN, [place], [place], [place], [place]

## 2022-10-07 LAB — CBC
HCT: 36.1 % — ABNORMAL LOW (ref 39.0–52.0)
Hemoglobin: 12.3 g/dL — ABNORMAL LOW (ref 13.0–17.0)
MCH: 30.9 pg (ref 26.0–34.0)
MCHC: 34.1 g/dL (ref 30.0–36.0)
MCV: 90.7 fL (ref 80.0–100.0)
Platelets: 429 10*3/uL — ABNORMAL HIGH (ref 150–400)
RBC: 3.98 MIL/uL — ABNORMAL LOW (ref 4.22–5.81)
RDW: 12.3 % (ref 11.5–15.5)
WBC: 7.2 10*3/uL (ref 4.0–10.5)
nRBC: 0 % (ref 0.0–0.2)

## 2022-10-07 MED ORDER — BOOST / RESOURCE BREEZE PO LIQD CUSTOM
1.0000 | Freq: Three times a day (TID) | ORAL | Status: DC
  Administered 2022-10-07 (×2): 1 via ORAL

## 2022-10-07 MED ORDER — PROSOURCE PLUS PO LIQD
30.0000 mL | Freq: Two times a day (BID) | ORAL | Status: DC
  Administered 2022-10-07: 30 mL via ORAL
  Filled 2022-10-07 (×2): qty 30

## 2022-10-07 MED ORDER — KCL IN DEXTROSE-NACL 20-5-0.45 MEQ/L-%-% IV SOLN: INTRAVENOUS | Status: DC

## 2022-10-07 NOTE — Progress Notes (Signed)
   10/07/22 1100  Mobility  Activity Ambulated independently in hallway  Level of Assistance Independent  Assistive Device None  Distance Ambulated (ft) 1100 ft  Activity Response Tolerated well  Mobility Referral Yes  $Mobility charge 1 Mobility   Mobility Specialist Progress Note  Pt was in bed and agreeable. Had no c/o pain. Returned to room w/ all needs met and call bell in reach.   Lucious Groves Mobility Specialist  Please contact via SecureChat or Rehab office at [phone]

## 2022-10-07 NOTE — Progress Notes (Signed)
[facility] Progress Note  6 Days Post-Op  Subjective: Feels well today.  Pain is well controlled on oxy and other scheduled meds.  No further N/V.  Tolerating sips of clears well.  Mobilizing 5+ times a day.  Objective: Vital signs in last 24 hours: Temp:  [97.9 F (36.6 C)-98.3 F (36.8 C)] 98.3 F (36.8 C) (02/29 0419) Pulse Rate:  [73-85] 74 (02/29 0419) Resp:  [16-18] 18 (02/29 0419) BP: (120-133)/(70-86) 120/70 (02/29 0419) SpO2:  [97 %-98 %] 98 % (02/29 0419) Last BM Date : 10/07/22  Intake/Output from previous day: 02/28 0701 - 02/29 0700 In: 1563.4 [P.O.:180; I.V.:1133.3; IV Piggyback:250] Out: 50 [Stool:50] Intake/Output this shift: No intake/output data recorded.  PE: Gen:  Alert, NAD, pleasant Abd: softer today, + BS, Colostomy with air and liquid stool in his pouch.  Stoma is pink and viable.  Midline wound is clean and packed  Lab Results:  Recent Labs    10/06/22 0102 10/07/22 0233  WBC 11.8* 7.2  HGB 13.6 12.3*  HCT 39.0 36.1*  PLT 490* 429*   BMET Recent Labs    10/05/22 0255 10/06/22 0102  NA 134* 136  K 3.9 3.9  CL 100 101  CO2 22 22  GLUCOSE 104* 107*  BUN 9 13  CREATININE 0.93 0.92  CALCIUM 8.8* 9.1   PT/INR No results for input(s): "LABPROT", "INR" in the last 72 hours. CMP     Component Value Date/Time   NA 136 10/06/2022 0102   K 3.9 10/06/2022 0102   CL 101 10/06/2022 0102   CO2 22 10/06/2022 0102   GLUCOSE 107 (H) 10/06/2022 0102   BUN 13 10/06/2022 0102   CREATININE 0.92 10/06/2022 0102   CALCIUM 9.1 10/06/2022 0102   PROT 7.7 09/27/2022 1758   ALBUMIN 3.8 09/27/2022 1758   AST 20 09/27/2022 1758   ALT 34 09/27/2022 1758   ALKPHOS 80 09/27/2022 1758   BILITOT 1.2 09/27/2022 1758   GFRNONAA >60 10/06/2022 0102   Lipase     Component Value Date/Time   LIPASE 33 09/27/2022 1758       Studies/Results: No results found.  Anti-infectives: Anti-infectives (From admission, onward)    Start      Dose/Rate Route Frequency Ordered Stop   09/28/22 1000  terbinafine (LAMISIL) tablet 250 mg  Status:  Discontinued       Note to Pharmacy: Take with food     250 mg Oral Daily 09/27/22 2356 10/01/22 1617   09/28/22 0600  piperacillin-tazobactam (ZOSYN) IVPB 3.375 g  Status:  Discontinued        3.375 g 12.5 mL/hr over 240 Minutes Intravenous Every 8 hours 09/27/22 2244 09/27/22 2356   09/28/22 0015  piperacillin-tazobactam (ZOSYN) IVPB 4.5 g  Status:  Discontinued        4.5 g 200 mL/hr over 30 Minutes Intravenous Every 6 hours 09/28/22 0011 09/28/22 0012   09/28/22 0015  piperacillin-tazobactam (ZOSYN) IVPB 3.375 g        3.375 g 12.5 mL/hr over 240 Minutes Intravenous Every 8 hours 09/28/22 0012 10/12/22 0559   09/27/22 2215  metroNIDAZOLE (FLAGYL) IVPB 500 mg  Status:  Discontinued        500 mg 100 mL/hr over 60 Minutes Intravenous Every 8 hours 09/27/22 2206 09/27/22 2234   09/27/22 2215  ciprofloxacin (CIPRO) IVPB 400 mg  Status:  Discontinued        400 mg 200 mL/hr over 60 Minutes Intravenous Every 12 hours 09/27/22 2206  09/27/22 2244        Assessment/Plan POD 6, s/p Hartmann's by Dr. Kae Heller 2/23 for Sigmoid diverticulitis with contained perforation without abscess -multi-modal pain control -AF and WBC back to normal today.  Given overall improvement, will hold on CT scan.  If he regresses again, will scan tomorrow. -if WBC remains normal and he continues to improve, will stop zosyn tomorrow -CLD, Breeze -mobilize, pulm toilet -HH already arranged for patient   FEN: CLD, breeze/IVFs ID: [email or web address], likely stop tomorrow if WBC remains normal VTE: lovenox   Eczema dermatitis Asthma Impetigo - mupirocin ointment BID x 5 days  I reviewed last 24 h vitals and pain scores, last 48 h intake and output, and last 24 h labs and trends.    LOS: 10 days    George Rodriguez, [facility] 10/07/2022, 8:40 AM Please see Amion for pager number during day hours  7:00am-4:30pm

## 2022-10-07 NOTE — Discharge Instructions (Signed)
[facility]      [facility] [phone]  OPEN ABDOMINAL SURGERY: POST OP INSTRUCTIONS  Always review your discharge instruction sheet given to you by the facility where your surgery was performed.  IF YOU HAVE DISABILITY OR FAMILY LEAVE FORMS, YOU MUST BRING THEM TO THE OFFICE FOR PROCESSING.  PLEASE DO NOT GIVE THEM TO YOUR DOCTOR.  A prescription for pain medication may be given to you upon discharge.  Take your pain medication as prescribed, if needed.  If narcotic pain medicine is not needed, then you may take acetaminophen (Tylenol) or ibuprofen (Advil) as needed. Take your usually prescribed medications unless otherwise directed. If you need a refill on your pain medication, please contact your pharmacy. They will contact our office to request authorization.  Prescriptions will not be filled after 5pm or on week-ends. You should follow a light diet the first few days after arrival home, such as soup and crackers, pudding, etc.unless your doctor has advised otherwise. A high-fiber, low fat diet can be resumed as tolerated.   Be sure to include lots of fluids daily. Most patients will experience some swelling and bruising on the chest and neck area.  Ice packs will help.  Swelling and bruising can take several days to resolve Most patients will experience some swelling and bruising in the area of the incision. Ice pack will help. Swelling and bruising can take several days to resolve..  It is common to experience some constipation if taking pain medication after surgery.  Increasing fluid intake and taking a stool softener will usually help or prevent this problem from occurring.  A mild laxative (Milk of Magnesia or Miralax) should be taken according to package directions if there are no bowel movements after 48 hours.  You may have steri-strips (small skin tapes) in place directly over the incision.  These strips should be left on the skin for 7-10 days.  If your surgeon used skin  glue on the incision, you may shower in 24 hours.  The glue will flake off over the next 2-3 weeks.  Any sutures or staples will be removed at the office during your follow-up visit. You may find that a light gauze bandage over your incision may keep your staples from being rubbed or pulled. You may shower and replace the bandage daily. ACTIVITIES:  You may resume regular (light) daily activities beginning the next day--such as daily self-care, walking, climbing stairs--gradually increasing activities as tolerated.  You may have sexual intercourse when it is comfortable.  Refrain from any heavy lifting or straining until approved by your doctor. You may drive when you no longer are taking prescription pain medication, you can comfortably wear a seatbelt, and you can safely maneuver your car and apply brakes Return to Work: ___________________________________ You should see your doctor in the office for a follow-up appointment approximately two weeks after your surgery.  Make sure that you call for this appointment within a day or two after you arrive home to insure a convenient appointment time. OTHER INSTRUCTIONS:  _____________________________________________________________ _____________________________________________________________  WHEN TO CALL YOUR DOCTOR: Fever over 101.0 Inability to urinate Nausea and/or vomiting Extreme swelling or bruising Continued bleeding from incision. Increased pain, redness, or drainage from the incision. Difficulty swallowing or breathing Muscle cramping or spasms. Numbness or tingling in hands or feet or around lips.  The clinic staff is available to answer your questions during regular business hours.  Please don't hesitate to call and ask to speak to one of   the nurses if you have concerns.  For further questions, please visit [email or web address]  WOUND CARE: - midline dressing to be changed daily - supplies: sterile saline, gauze, scissors,  tape  - remove dressing and all packing carefully, moistening with sterile saline as needed to avoid packing/internal dressing sticking to the wound. - clean edges of skin around the wound with water/gauze, making sure there is no tape debris or leakage left on skin that could cause skin irritation or breakdown. - dampen and clean gauze with sterile saline and pack wound from wound base to skin level, making sure to take note of any possible areas of wound tracking, tunneling and packing appropriately. Wound can be packed loosely. Trim gauze to size if a whole gauze is not required. - cover wound with a dry gauze and secure with tape.  - write the date/time on the dry dressing/tape to better track when the last dressing change occurred. - change dressing as needed if leakage occurs, wound gets contaminated, or patient requests to shower. - patient may shower daily with wound open (i.e. remove all packing) and following the shower the wound should be dried and a clean dressing placed.   ================================================================================  Rivaroxaban (Xarelto) ED Discharge Instructions   Patient received a prescription for  Xarelto 15 & 20 mg - 51 tablet VTE STARTER PACK.   Patient understands only the FIRST 30 DAYS OF TREATMENT  will be provided by the starter pack.   Patient understands to contact primary care doctor or ED immediately if for any reason is unable to fill the starter pack prescription.  Patient must schedule a follow-up appointment with primary care doctor within 15 days of discharge in order to receive the maintenance prescription and clinical follow up.  Patient has received an education kit containing ([facility] Trial Offer Card, DVT/PE brochure, Dosing Diary, and Xarelto Medication Guide).   If not performed in the ED, patient will receive medication counseling by a [facility] pharmacist via phone follow-up within the next 72 hours. Pharmacist  to review signs and symptoms of bleeding and proper use of this medication.   Call 911 or return immediately to the nearest ED if you develop bleeding (e.g. nose, gums, vomit, urine, bloody or dark stools), unusual bruising, head trauma (even if minor), severe headache, altered mental status, change in speech, weakness on one side of body, shortness of breath, swollen lips/tongue/face/neck, chest pain, or other concerns.    Information on my medicine - XARELTO (rivaroxaban)  This medication education was provided to me or my healthcare representative as part of my discharge instructions.   WHY WAS XARELTO PRESCRIBED FOR YOU?  Xarelto was prescribed to treat blood clots that may have been found in the veins of your legs (deep vein thrombosis) or in your lungs (pulmonary embolism) and to reduce the risk of them occurring again.   WHAT DO YOU NEED TO KNOW ABOUT XARELTO?  The starting dose is one 15 mg tablet taken TWICE daily with food for the FIRST 21 DAYS then on day 22 the dose is changed to one 20 mg tablet taken ONCE A DAY with your evening meal.   DO NOT stop taking Xarelto without talking to the health care provider who prescribed the medication. Refill your prescription for 20 mg tablets before you run out.  After discharge, you should have regular check-up appointments with your healthcare provider that is prescribing your Xarelto. In the future your dose may need to be changed if your kidney function   changes by a significant amount.   WHAT DO YOU DO IF YOU MISS A DOSE?  If you are taking Xarelto TWICE DAILY and you miss a dose, take it as soon as you remember. You may take two 15 mg tablets (total 30 mg) at the same time then resume your regularly scheduled 15 mg twice daily the next day.   If you are taking Xarelto ONCE DAILY and you miss a dose, take it as soon as you remember on the same day then continue your regularly scheduled once daily regimen the next day. Do not take two  doses of Xarelto at the same time.   IMPORTANT SAFETY INFORMATION  Xarelto is a blood thinner medicine that can cause bleeding. You should call your healthcare provider right away if you experience any of the following:  -  Bleeding from an injury or your nose that does not stop.  -  Unusual colored urine (red or dark brown) or unusual colored stools (red or black).  -  Unusual bruising for unknown reasons.  -  A serious fall or if you hit your head (even if there is no bleeding).   Some medicines may interact with Xarelto and might increase your risk of bleeding while on Xarelto. To help avoid this, consult your healthcare provider or pharmacist prior to using any new prescription or non-prescription medications, including herbals, vitamins, non-steroidal anti-inflammatory drugs (NSAIDs) and supplements.   This website has more information on Xarelto: www.xarelto.com. 

## 2022-10-07 NOTE — Care Plan (Signed)
Dr Dema Severin at bedside and states patient looks good, tolerating diet.  States incision looks good.  He states patient can advance to regular diet at this time, nothing crazy, but ok to advance.  Probable dc tomorrow. Sreece, RN

## 2022-10-07 NOTE — Progress Notes (Signed)
Nutrition Follow-up  DOCUMENTATION CODES:   Obesity unspecified  INTERVENTION:   - Boost Breeze po TID, each supplement provides 250 kcal and 9 grams of protein  - PROSource Plus po BID, each supplement provides 100 kcal and 15 grams of protein  - If unable to continue to advance diet, recommend initiation of TPN  NUTRITION DIAGNOSIS:   Inadequate oral intake related to acute illness, altered GI function as evidenced by NPO status.  Progressing, pt now on clear liquid diet  GOAL:   Patient will meet greater than or equal to 90% of their needs  Unmet at this time  MONITOR:   Diet advancement, Labs, I & O's  REASON FOR ASSESSMENT:   NPO/Clear Liquid Diet    ASSESSMENT:   Pt admitted with LLQ abdominal pain 2/2 sigmoid diverticulitis. No significant PMH on file.  02/23 - s/p ex lap, sigmoid colectomy, end colostomy 02/24 - NG tube clamping, sips of clears from the floor 02/26 - clear liquids 02/27 - full liquids, back to clear liquids 02/28 - soft diet 02/29 - NPO, clear liquids  Attempted to speak with pt at bedside. However, pt not in room at time of RD visit. Per Surgery note, pt starting on clears today with plans to advance as tolerated. If pt regresses, plan for CT with contrast tomorrow to evaluate for abscess. Discussed recommendation for TPN with Surgery since pt has been admitted for 9 days with <24 hours on soft diet and <24 hours on full liquid diet. Surgery reports no plans for TPN today given pt is improved and progressing.  Medications reviewed and include: Boost Breeze TID, IV abx IVF: D5 and 1/2NS with KCl @ 50 ml/hr  Labs reviewed.  Colostomy: 50 ml x 24 hours  Diet Order:   Diet Order             Diet clear liquid Fluid consistency: Thin  Diet effective now                   EDUCATION NEEDS:   Education needs have been addressed  Skin:  Skin Assessment: Reviewed RN Assessment (abdominal incision: new colostomy)  Last BM:   10/07/22 colostomy  Height:   Ht Readings from Last 1 Encounters:  10/01/22 '6\' 1"'$  (1.854 m)    Weight:   Wt Readings from Last 1 Encounters:  10/01/22 120.2 kg    Ideal Body Weight:  83.6 kg  BMI:  Body mass index is 34.96 kg/m.  Estimated Nutritional Needs:   Kcal:  2200-2400  Protein:  110-125g  Fluid:  >/=2L    Gustavus Bryant, MS, RD, LDN Inpatient Clinical Dietitian Please see AMiON for contact information.

## 2022-10-07 NOTE — TOC Progression Note (Signed)
Transition of Care (TOC) - Progression Note   Provided update including WOC note / education documentation to [facility] . [employer]    They will need to be called  ( [phone] ) on day of discharge and discharge summary   faxed to them Fax [phone]   Patient Details  Name: George Rodriguez MRN: [id number] Date of Birth: [birth year]  Transition of Care [facility]) CM/SW Contact  Kou Gucciardo, Edson Snowball, RN Phone Number: 10/07/2022, 7:47 AM  Clinical Narrative:       Expected Discharge Plan: [place] Barriers to Discharge: Continued Medical Work up  Expected Discharge Plan and Services   Discharge Planning Services: CM Consult Post Acute Care Choice: [place] arrangements for the past 2 months: Single Family Home                 DME Arranged: N/A DME Agency: NA       HH Arranged: RN (see note)           Social Determinants of Health (SDOH) Interventions [place]: Low Risk  (09/28/2022)  Transportation Needs: No Transportation Needs (09/28/2022)  Utilities: Not At Risk (09/28/2022)  Tobacco Use: Medium Risk (10/02/2022)    Readmission Risk Interventions     No data to display

## 2022-10-08 ENCOUNTER — Other Ambulatory Visit: Payer: Self-pay

## 2022-10-08 ENCOUNTER — Other Ambulatory Visit (HOSPITAL_COMMUNITY): Payer: Self-pay

## 2022-10-08 LAB — CBC
HCT: 38.3 % — ABNORMAL LOW (ref 39.0–52.0)
Hemoglobin: 12.9 g/dL — ABNORMAL LOW (ref 13.0–17.0)
MCH: 30.8 pg (ref 26.0–34.0)
MCHC: 33.7 g/dL (ref 30.0–36.0)
MCV: 91.4 fL (ref 80.0–100.0)
Platelets: 482 10*3/uL — ABNORMAL HIGH (ref 150–400)
RBC: 4.19 MIL/uL — ABNORMAL LOW (ref 4.22–5.81)
RDW: 12.2 % (ref 11.5–15.5)
WBC: 8.5 10*3/uL (ref 4.0–10.5)
nRBC: 0 % (ref 0.0–0.2)

## 2022-10-08 MED ORDER — ACETAMINOPHEN 500 MG PO TABS: 1000.0000 mg | ORAL_TABLET | Freq: Four times a day (QID) | ORAL | 0 refills | Status: DC | PRN

## 2022-10-08 MED ORDER — OXYCODONE HCL 5 MG PO TABS
5.0000 mg | ORAL_TABLET | Freq: Four times a day (QID) | ORAL | 0 refills | Status: DC | PRN
  Filled 2022-10-08: qty 30, 5d supply, fill #0

## 2022-10-08 MED ORDER — METHOCARBAMOL 500 MG PO TABS
500.0000 mg | ORAL_TABLET | Freq: Three times a day (TID) | ORAL | 0 refills | Status: DC | PRN
  Filled 2022-10-08: qty 50, 17d supply, fill #0

## 2022-10-08 NOTE — Consult Note (Signed)
[facility] Nurse ostomy follow up Stoma type/location:  LLQ end colostomy  Stomal assessment/size: 1 5/6" oval shaped, pink moist flush with skin Peristomal assessment: intact; mild contact dermatitis noted at previous visit not noted today.  Will continue to encourage patient to use no sting skin barrier wipes on skin when changing ostomy appliance Treatment options for stomal/peristomal skin: 2" barrier ring, no sting skin prep  Output moderate amount of brown stool in bag  Ostomy pouching: 2 piece 2 3/4" Kellie Simmering #2 skin barrier, Kellie Simmering [phone] pouch) Education provided: Girlfriend performed pouch change today.  Girlfriend states she has been utilizing lock and roll mechanism to empty pouch, cleans spout with toilet paper wick and closes lock and roll.  GF was able to remove old pouch, measure stoma and cut new skin barrier accordingly.  We discussed measuring stoma for the next 2-4 weeks with each pouch change and measuring device is in with pouches to take home.  GF placed 2" skin barrier ring around stoma and then placed skin barrier as well.  GF able to secure pouch to skin barrier and close using lock and roll.  GF and patient feel confident in their care of ostomy.    Patient does have referral to ostomy clinic and I did discuss with them this being an excellent resource for them going forward.  Plan is for patient to be discharged home today, has barrier rings, skin prep, 5 pouches and 5 skin barriers in room to take home.    Enrolled patient in [place] Start Discharge program: Yes  WOC will continue to follow patient for further ostomy education and support.   Thank you,    Jamye Balicki MSN, RN-BC, [employer]

## 2022-10-08 NOTE — Progress Notes (Signed)
Instructions given to patient and wife.  Meds discussed, s/s of infection, colostomy carer and education.  Both verbalize understanding and denies additional needs.  Patient and family to walk to [facility] for medications.  No further needs. Sreece, RN

## 2022-10-08 NOTE — Discharge Summary (Signed)
Patient ID: George Rodriguez [id number] 06-03-1991 32 y.o.  Admit date: 09/27/2022 Discharge date: 10/08/2022  Admitting Diagnosis: Acute diverticulitis with contained perforation  Discharge Diagnosis Patient Active Problem List   Diagnosis Date Noted   Diverticulitis 09/27/2022   Asthma 07/13/2017   Allergic rhinitis 07/13/2017  S/p Hartmann's procedure Eczema   Consultants none  Reason for Admission: George Rodriguez is an 32 y.o. male with hx eczema, presented to [facility] ED with 2d hx of LLQ abdominal pain.  Reports some apparent blood in his urine and some pain with urination.  Last bowel was 2 days ago.  He has been having flatus.  Does have some loss of appetite but no vomiting.  He reports that he never had this exact kind of pain but he does feel somewhat similar to when he had appendicitis 15 years ago.  Went to urgent care this morning and was referred to the ER.  He contemplated actually going back home.   Reports a history of appendectomy = laparoscopic.  This was at [facility] and he reports that he had no evident evisceration that required repair on postop day #1.  Denies any other abdominal or pelvic surgery.   His fiance is at bedside.  Procedures Dr. Romana Juniper, 10/01/22 exploratory laparotomy, sigmoid partial colectomy with end colostomy   Hospital Course:  The patient was admitted and started on IV abx therapy and conservative management for his diverticulitis with contained perforation.  Unfortunately, the patient continued to have progressive symptoms, fevers, and worsening inflammation and extraluminal air on follow up CT.  The decision was made to proceed with Hartmann's procedure.  This was done on 2/23.  He tolerated this procedure well.  He has NS WD dressing changes to his midline wound BID while here as well as routine colostomy care.  He and his fiancee did well with this.  He received a total of 7 days of post op abx therapy.  He had a slightly ileus post op with  an NGT.  As his bag started functioning his tube was able to be removed.  As we advanced his diet initially, he did develop N/V x1.  His diet was backed down, but then he resolved quickly and it was able to be re-advanced.  He tolerated this well.  He was doing very well otherwise on POD 7, with no other issues.  He was felt stable at this time for DC home.  Physical Exam: Abd: soft, appropriately tender, wound is open, packed, and stable, fascia intact, colostomy with air and stool present.  Stoma is pink and viable.  Allergies as of 10/08/2022   No Known Allergies      Medication List     TAKE these medications    acetaminophen 500 MG tablet Commonly known as: TYLENOL Take 2 tablets (1,000 mg total) by mouth every 6 (six) hours as needed.   albuterol 108 (90 Base) MCG/ACT inhaler Commonly known as: VENTOLIN HFA INHALE 1-2 PUFFS INTO THE LUNGS EVERY 4 HOURS AS NEEDED FOR WHEEZING OR SHORTNESS OF BREATH. Please call for an appointment for more refills   albuterol 108 (90 Base) MCG/ACT inhaler Commonly known as: VENTOLIN HFA Inhale 2 puffs into the lungs every 6 (six) hours as needed for wheezing or shortness of breath.   Breo Ellipta 100-25 MCG/ACT Aepb Generic drug: fluticasone furoate-vilanterol INHALE 1 PUFF BY MOUTH EVERY DAY   clobetasol 0.05 % topical foam Commonly known as: OLUX Apply to more severe areas eczema  once to twice a day until improved. Avoid face, groin, underarms.   Clobetasol Propionate Emulsion 0.05 % topical foam Apply BID to affected areas on body up to 2 weeks as needed. Avoid applying to face, groin, and axilla.   Dupixent 300 MG/2ML prefilled syringe Generic drug: dupilumab Inject 300 mg into the skin every 14 (fourteen) days.   fluticasone 50 MCG/ACT nasal spray Commonly known as: FLONASE Place 2 sprays into both nostrils daily.   methocarbamol 500 MG tablet Commonly known as: ROBAXIN Take 1 tablet (500 mg total) by mouth every 8 (eight) hours  as needed for muscle spasms.   Opzelura 1.5 % Crea Generic drug: Ruxolitinib Phosphate Apply 1 application  topically in the morning and at bedtime. Apply to affected areas of face and body.   oxyCODONE 5 MG immediate release tablet Commonly known as: Oxy IR/ROXICODONE Take 1-2 tablets (5-10 mg total) by mouth every 6 (six) hours as needed for moderate pain.   terbinafine 250 MG tablet Commonly known as: LamISIL Take 1 tablet (250 mg total) by mouth daily. Take with food   triamcinolone 55 MCG/ACT Aero nasal inhaler Commonly known as: NASACORT Place 2 sprays into the nose daily.          Follow-up Information     [facility] [place] Follow up.   Contact information: [place] [id number] [place]. Schedule an appointment as soon as possible for a visit.   Specialty: General Surgery Contact information: [address] [id number] Danice Goltz [place] [id number] [phone]        Clovis Riley, MD Follow up on 10/29/2022.   Specialty: General Surgery Why: 1:40pm, Arrive 30 minutes prior to your appointment time, Please bring your insurance card and photo ID Contact information: [address] [place] [address] [phone]                 Signed: Saverio Danker, [facility] 10/08/2022, 9:58 AM Please see Amion for pager number during day hours 7:00am-4:30pm, 7-11:30am on Weekends

## 2022-10-08 NOTE — TOC Progression Note (Signed)
Transition of Care (TOC) - Progression Note   Was told by [name] PA , patient and wife doing well with care and they are fine to go home with no home health ( patient preference) .   Spoke to patient and family at bedside and confirmed . [name] with [place] aware  Patient Details  Name: George Rodriguez MRN: [id number] Date of Birth: 1990/10/18  Transition of Care [facility]) CM/SW Contact  Lauri Purdum, Edson Snowball, RN Phone Number: 10/08/2022, 11:14 AM  Clinical Narrative:       Expected Discharge Plan: [place] Barriers to Discharge: Continued Medical Work up  Expected Discharge Plan and Services   Discharge Planning Services: CM Consult Post Acute Care Choice: [place] arrangements for the past 2 months: Single Family Home Expected Discharge Date: 10/08/22               DME Arranged: N/A DME Agency: NA       HH Arranged: RN (see note)           Social Determinants of Health (SDOH) Interventions SDOH Screenings   Food Insecurity: No Food Insecurity (10/08/2022)  Housing: Low Risk  (09/28/2022)  Transportation Needs: No Transportation Needs (09/28/2022)  Utilities: Not At Risk (09/28/2022)  Tobacco Use: Medium Risk (10/02/2022)    Readmission Risk Interventions     No data to display

## 2022-10-08 NOTE — Plan of Care (Signed)
  Problem: Education: Goal: Knowledge of General Education information will improve Description: Including pain rating scale, medication(s)/side effects and non-pharmacologic comfort measures Outcome: Progressing   Problem: Health Behavior/Discharge Planning: Goal: Ability to manage health-related needs will improve Outcome: Progressing   Problem: Clinical Measurements: Goal: Respiratory complications will improve Outcome: Progressing   Problem: Activity: Goal: Risk for activity intolerance will decrease Outcome: Progressing   Problem: Nutrition: Goal: Adequate nutrition will be maintained Outcome: Progressing   Problem: Coping: Goal: Level of anxiety will decrease Outcome: Progressing   Problem: Elimination: Goal: Will not experience complications related to bowel motility Outcome: Progressing Goal: Will not experience complications related to urinary retention Outcome: Progressing   Problem: Safety: Goal: Ability to remain free from injury will improve Outcome: Progressing   Problem: Skin Integrity: Goal: Risk for impaired skin integrity will decrease Outcome: Progressing   Problem: Pain Managment: Goal: General experience of comfort will improve Outcome: Progressing

## 2022-10-11 ENCOUNTER — Telehealth: Payer: Self-pay

## 2022-10-11 NOTE — Transitions of Care (Post Inpatient/ED Visit) (Unsigned)
   10/11/2022  Name: George Rodriguez MRN: [id number] DOB: November 07, 1990  Today's TOC FU Call Status: Today's TOC FU Call Status:: Unsuccessul Call (1st Attempt) Unsuccessful Call (1st Attempt) Date: 10/11/22  Attempted to reach the patient regarding the most recent Inpatient/ED visit.  Follow Up Plan: Additional outreach attempts will be made to reach the patient to complete the Transitions of Care (Post Inpatient/ED visit) call.   Signature Juanda Crumble, [place] Direct Dial [phone]

## 2022-10-12 NOTE — Transitions of Care (Post Inpatient/ED Visit) (Signed)
   10/12/2022  Name: Martinique D Feinberg MRN: [id number] DOB: 1991-04-04  Today's TOC FU Call Status: Today's TOC FU Call Status:: Successful TOC FU Call Competed Unsuccessful Call (1st Attempt) Date: 10/11/22 [facility] FU Call Complete Date: 10/12/22  Transition Care Management Follow-up Telephone Call Date of Discharge: 10/08/22 Discharge Facility: [facility]) Type of Discharge: Inpatient Admission Primary Inpatient Discharge Diagnosis:: diverticulitis How have you been since you were released from the hospital?: Better Any questions or concerns?: No  Items Reviewed: Did you receive and understand the discharge instructions provided?: Yes Medications obtained and verified?: Yes (Medications Reviewed) Any new allergies since your discharge?: No Dietary orders reviewed?: Yes Do you have support at home?: Yes People in Home: spouse  Home Care and Equipment/Supplies: [place] Ordered?: NA Any new equipment or medical supplies ordered?: Yes Name of Medical supply agency?: [facility] Were you able to get the equipment/medical supplies?: No  Functional Questionnaire: Do you need assistance with bathing/showering or dressing?: No Do you need assistance with meal preparation?: No Do you need assistance with eating?: No Do you have difficulty maintaining continence: No Do you need assistance with getting out of bed/getting out of a chair/moving?: No Do you have difficulty managing or taking your medications?: No  Folllow up appointments reviewed: PCP Follow-up appointment confirmed?: [facility] Follow-up appointment confirmed?: Yes Date of Specialist follow-up appointment?: 10/29/22 Follow-Up Specialty Provider:: Dr Kae Heller Do you need transportation to your follow-up appointment?: No Do you understand care options if your condition(s) worsen?: Yes-patient verbalized understanding    [place], [place] Nurse Health Advisor Direct Dial  [phone]

## 2022-10-18 ENCOUNTER — Ambulatory Visit (HOSPITAL_COMMUNITY)
Admission: RE | Admit: 2022-10-18 | Discharge: 2022-10-18 | Disposition: A | Discharge status: Home / Self Care | Payer: BC Managed Care – PPO | Source: Ambulatory Visit | Attending: Family Medicine | Admitting: Family Medicine

## 2022-10-18 DIAGNOSIS — Z433 Encounter for attention to colostomy: Secondary | ICD-10-CM | POA: Insufficient documentation

## 2022-10-18 DIAGNOSIS — K9400 Colostomy complication, unspecified: Secondary | ICD-10-CM | POA: Diagnosis not present

## 2022-10-18 DIAGNOSIS — L24B3 Irritant contact dermatitis related to fecal or urinary stoma or fistula: Secondary | ICD-10-CM | POA: Diagnosis not present

## 2022-10-18 NOTE — Progress Notes (Signed)
[facility]   Reason for visit:  LLQ colostomy, flush and oval HPI:  Diverticulitis with end colostomy Past Medical History:  Diagnosis Date   Anxiety    Asthma    Dermatitis    Eczema    Family History  Problem Relation Age of Onset   Hyperlipidemia Mother    Hypertension Father    Heart attack Paternal Grandmother    Alcohol abuse Paternal Grandfather    No Known Allergies Current Outpatient Medications  Medication Sig Dispense Refill Last Dose   acetaminophen (TYLENOL) 500 MG tablet Take 2 tablets (1,000 mg total) by mouth every 6 (six) hours as needed. 30 tablet 0    albuterol (VENTOLIN HFA) 108 (90 Base) MCG/ACT inhaler INHALE 1-2 PUFFS INTO THE LUNGS EVERY 4 HOURS AS NEEDED FOR WHEEZING OR SHORTNESS OF BREATH. Please call for an appointment for more refills 6.7 each 0    albuterol (VENTOLIN HFA) 108 (90 Base) MCG/ACT inhaler Inhale 2 puffs into the lungs every 6 (six) hours as needed for wheezing or shortness of breath. 8 g 0    BREO ELLIPTA 100-25 MCG/INH AEPB INHALE 1 PUFF BY MOUTH EVERY DAY (Patient not taking: Reported on 10/12/2022) 60 each 0    clobetasol (OLUX) 0.05 % topical foam Apply to more severe areas eczema once to twice a day until improved. Avoid face, groin, underarms. 100 g 2    Clobetasol Propionate Emulsion 0.05 % topical foam Apply BID to affected areas on body up to 2 weeks as needed. Avoid applying to face, groin, and axilla. 100 g 2    dupilumab (DUPIXENT) 300 MG/2ML prefilled syringe Inject 300 mg into the skin every 14 (fourteen) days. 4 mL 5    fluticasone (FLONASE) 50 MCG/ACT nasal spray Place 2 sprays into both nostrils daily. 16 g 6    methocarbamol (ROBAXIN) 500 MG tablet Take 1 tablet (500 mg total) by mouth every 8 (eight) hours as needed for muscle spasms. 50 tablet 0    oxyCODONE (OXY IR/ROXICODONE) 5 MG immediate release tablet Take 1-2 tablets (5-10 mg total) by mouth every 6 (six) hours as needed for moderate pain. 30 tablet 0     Ruxolitinib Phosphate (OPZELURA) 1.5 % CREA Apply 1 application  topically in the morning and at bedtime. Apply to affected areas of face and body. 60 g 3    terbinafine (LAMISIL) 250 MG tablet Take 1 tablet (250 mg total) by mouth daily. Take with food 30 tablet 1    triamcinolone (NASACORT) 55 MCG/ACT AERO nasal inhaler Place 2 sprays into the nose daily.      No current facility-administered medications for this encounter.   ROS  Review of Systems  Gastrointestinal:        Colostomy  Skin:  Positive for rash.       Peristomal breakdown  All other systems reviewed and are negative.  Vital signs:  BP (!) 161/99 (BP Location: Right Arm)   Pulse 97   Temp 98 F (36.7 C) (Oral)   Resp 18   SpO2 98%  Exam:  Physical Exam Vitals reviewed.  Constitutional:      Appearance: Normal appearance.  Abdominal:     Palpations: Abdomen is soft.  Skin:    General: Skin is warm and dry.     Findings: Rash present.  Neurological:     Mental Status: He is alert and oriented to person, place, and time.  Psychiatric:        Mood  and Affect: Mood normal.        Behavior: Behavior normal.     Stoma type/location:  LLQ colostomy, flush and oval Stomal assessment/size:  1 cm x 1.5 cm flush, oval Peristomal assessment:  intact, erythema Treatment options for stomal/peristomal skin: wife states there has been some episodes of leakage.  Will benefit from a convex barrier.  I suggest a one piece as well.  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  I recommend a belt as well  Output: soft brown stool Ostomy pouching: 1pc. Convex with barrier ring, stoma powder and skin prep  adding an ostomy belt.  Education provided:  we perform pouch change and I provide samples.  I will set up with [facility] for supplies.     Impression/dx  Colostomy complication Discussion  We discuss life with an ostomy, risk of hernia (specifically after returning to work, lifting)  intimacy and sex  Plan   See back in 2 weeks    Visit time: 55 minutes.   George Moras FNP-BC

## 2022-10-19 DIAGNOSIS — L24B3 Irritant contact dermatitis related to fecal or urinary stoma or fistula: Secondary | ICD-10-CM | POA: Insufficient documentation

## 2022-10-19 DIAGNOSIS — K9400 Colostomy complication, unspecified: Secondary | ICD-10-CM | POA: Insufficient documentation

## 2022-10-19 NOTE — Discharge Instructions (Signed)
Enroll with [facility]

## 2022-10-26 ENCOUNTER — Ambulatory Visit: Payer: BC Managed Care – PPO | Admitting: Dermatology

## 2022-10-28 ENCOUNTER — Other Ambulatory Visit (HOSPITAL_COMMUNITY): Payer: Self-pay | Admitting: Nurse Practitioner

## 2022-10-28 DIAGNOSIS — L24B3 Irritant contact dermatitis related to fecal or urinary stoma or fistula: Secondary | ICD-10-CM

## 2022-10-28 DIAGNOSIS — K9400 Colostomy complication, unspecified: Secondary | ICD-10-CM

## 2022-10-29 ENCOUNTER — Ambulatory Visit (HOSPITAL_COMMUNITY)
Admission: RE | Admit: 2022-10-29 | Discharge: 2022-10-29 | Disposition: A | Discharge status: Home / Self Care | Payer: BC Managed Care – PPO | Source: Ambulatory Visit | Attending: Family Medicine | Admitting: Family Medicine

## 2022-10-29 ENCOUNTER — Encounter (HOSPITAL_COMMUNITY): Payer: Self-pay

## 2022-11-19 ENCOUNTER — Encounter: Payer: Self-pay | Admitting: Internal Medicine

## 2022-12-28 ENCOUNTER — Ambulatory Visit: Payer: BC Managed Care – PPO | Admitting: Dermatology

## 2023-01-25 ENCOUNTER — Other Ambulatory Visit: Payer: Self-pay

## 2023-01-25 ENCOUNTER — Ambulatory Visit (INDEPENDENT_AMBULATORY_CARE_PROVIDER_SITE_OTHER): Payer: BC Managed Care – PPO | Admitting: Internal Medicine

## 2023-01-25 ENCOUNTER — Encounter: Payer: Self-pay | Admitting: Internal Medicine

## 2023-01-25 VITALS — BP 134/86 | HR 76 | Ht 71.5 in | Wt 249.5 lb

## 2023-01-25 DIAGNOSIS — K9400 Colostomy complication, unspecified: Secondary | ICD-10-CM

## 2023-01-25 DIAGNOSIS — R935 Abnormal findings on diagnostic imaging of other abdominal regions, including retroperitoneum: Secondary | ICD-10-CM | POA: Diagnosis not present

## 2023-01-25 DIAGNOSIS — L209 Atopic dermatitis, unspecified: Secondary | ICD-10-CM

## 2023-01-25 DIAGNOSIS — K5720 Diverticulitis of large intestine with perforation and abscess without bleeding: Secondary | ICD-10-CM | POA: Diagnosis not present

## 2023-01-25 MED ORDER — DUPIXENT 300 MG/2ML ~~LOC~~ SOSY: 300.0000 mg | PREFILLED_SYRINGE | SUBCUTANEOUS | 0 refills | Status: DC

## 2023-01-25 MED ORDER — PLENVU 140 G PO SOLR: 1.0000 | Freq: Once | ORAL | 0 refills | Status: AC

## 2023-01-25 NOTE — Progress Notes (Signed)
Refill request faxed from [facility]. 1 refill sent in to get patient to 7/8 apt.

## 2023-01-25 NOTE — Patient Instructions (Signed)
You have been scheduled for a colonoscopy. Please follow written instructions given to you at your visit today.  Please pick up your prep supplies at the pharmacy within the next 1-3 days. If you use inhalers (even only as needed), please bring them with you on the day of your procedure.  Use a Fleet enema about 2 hours prior to your procedure.  _______________________________________________________  If your blood pressure at your visit was 140/90 or greater, please contact your primary care physician to follow up on this.  _______________________________________________________  If you are age 81 or older, your body mass index should be between 23-30. Your Body mass index is 34.31 kg/m. If this is out of the aforementioned range listed, please consider follow up with your Primary Care Provider.  If you are age 12 or younger, your body mass index should be between 19-25. Your Body mass index is 34.31 kg/m. If this is out of the aformentioned range listed, please consider follow up with your Primary Care Provider.   ________________________________________________________  The [facility] providers would like to encourage you to use [facility] to communicate with providers for non-urgent requests or questions.  Due to long hold times on the telephone, sending your provider a message by [facility] may be a faster and more efficient way to get a response.  Please allow 48 business hours for a response.  Please remember that this is for non-urgent requests.  _______________________________________________________

## 2023-01-25 NOTE — Progress Notes (Signed)
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  He presents today, with his wife, regarding the need for colonoscopy after suffering perforated diverticulitis requiring emergent surgery with colostomy formation and Hartmann pouch.  Laboratories and x-rays reviewed.  His procedure was performed by Dr. Doylene Canard October 01, 2022.  Reviewed.  He was hospitalized for 11 days.  Since discharge she has done well.  He has had prior appendectomy.  There may be a family history of colon cancer in a great grandparent, though uncertain.  He has not had prior GI evaluations.  No bleeding or mucus per rectum.  GI review of systems is otherwise negative  REVIEW OF SYSTEMS:  All non-GI ROS negative except for sinus and allergy trouble, itching, eczema, blood in urine  Past Medical History:  Diagnosis Date   Anxiety    Asthma    Dermatitis    Diverticulitis    Eczema    HTN (hypertension)     Past Surgical History:  Procedure Laterality Date   APPENDECTOMY     COLON RESECTION SIGMOID  10/01/2022   Procedure: COLON RESECTION SIGMOID;  Surgeon: Berna Bue, MD;  Location: [facility] OR;  Service: General;;   COLOSTOMY  10/01/2022   Procedure: COLOSTOMY;  Surgeon: Berna Bue, MD;  Location: [facility] OR;  Service: General;;   LAPAROTOMY N/A 10/01/2022   Procedure: EXPLORATORY LAPAROTOMY;  Surgeon: Berna Bue, MD;  Location: [facility] OR;  Service: General;  Laterality: N/A;    Social History George D Percival  reports that he has never smoked. His smokeless tobacco use includes chew. He reports current alcohol use of about 16.0 standard drinks of alcohol per week. He reports that he does not use drugs.  family history includes Alcohol abuse in his paternal grandfather; Colon cancer in his paternal grandmother and paternal great-grandmother; Heart attack in his paternal grandmother; Hyperlipidemia in his mother; Hypertension in his  father.  No Known Allergies     PHYSICAL EXAMINATION: Vital signs: BP 134/86 (BP Location: Left Arm, Patient Position: Sitting, Cuff Size: Large)   Pulse 76   Ht 5' 11.5" (1.816 m) Comment: height measured without shoes  Wt 249 lb 8 oz (113.2 kg)   BMI 34.31 kg/m   Constitutional: generally well-appearing, no acute distress Psychiatric: alert and oriented x3, cooperative Eyes: extraocular movements intact, anicteric, conjunctiva pink Mouth: oral pharynx moist, no lesions Neck: supple no lymphadenopathy Cardiovascular: heart regular rate and rhythm, no murmur Lungs: clear to auscultation bilaterally Abdomen: soft, nontender, nondistended, no obvious ascites, no peritoneal signs, normal bowel sounds, no organomegaly.  Ostomy intact Rectal: Deferred until colonoscopy Extremities: no clubbing, cyanosis, or lower extremity edema bilaterally Skin: no lesions on visible extremities Neuro: No focal deficits.  Cranial nerves intact  ASSESSMENT:  1.  Perforated sigmoid diverticulitis with subsequent urgent laparotomy with colostomy and Hartmann pouch formation.  Has recovered nicely.  Anticipating reversal.  Colonoscopy requested   PLAN:  1.  Colonoscopy per ostomy and per rectum.The nature of the procedure, as well as the risks, benefits, and alternatives were carefully and thoroughly reviewed with the patient. Ample time for discussion and questions allowed. The patient understood, was satisfied, and agreed to proceed. 2.  Follow-up with general surgery after colonoscopy completed

## 2023-02-14 ENCOUNTER — Ambulatory Visit (INDEPENDENT_AMBULATORY_CARE_PROVIDER_SITE_OTHER): Payer: BC Managed Care – PPO | Admitting: Dermatology

## 2023-02-14 VITALS — BP 168/86

## 2023-02-14 DIAGNOSIS — L209 Atopic dermatitis, unspecified: Secondary | ICD-10-CM

## 2023-02-14 DIAGNOSIS — Z79899 Other long term (current) drug therapy: Secondary | ICD-10-CM

## 2023-02-14 MED ORDER — CLOBETASOL PROPIONATE 0.05 % EX FOAM: CUTANEOUS | 4 refills | Status: DC

## 2023-02-14 MED ORDER — DUPIXENT 300 MG/2ML ~~LOC~~ SOSY: 300.0000 mg | PREFILLED_SYRINGE | SUBCUTANEOUS | 6 refills | Status: DC

## 2023-02-14 MED ORDER — OPZELURA 1.5 % EX CREA: 1.0000 | TOPICAL_CREAM | CUTANEOUS | 6 refills | Status: DC

## 2023-02-14 NOTE — Patient Instructions (Addendum)
Your prescription was sent to [facility] in [place]. A representative from [facility] will contact you within 3 business hours to verify your address and insurance information to schedule a free delivery. If for any reason you do not receive a phone call from them, please reach out to them. Their phone number is [phone] and their hours are Monday-Friday 9:00 am-5:00 pm.      Due to recent changes in healthcare laws, you may see results of your pathology and/or laboratory studies on MyChart before the doctors have had a chance to review them. We understand that in some cases there may be results that are confusing or concerning to you. Please understand that not all results are received at the same time and often the doctors may need to interpret multiple results in order to provide you with the best plan of care or course of treatment. Therefore, we ask that you please give us 2 business days to thoroughly review all your results before contacting the office for clarification. Should we see a critical lab result, you will be contacted sooner.   If You Need Anything After Your Visit  If you have any questions or concerns for your doctor, please call our main line at [phone] and press option 4 to reach your doctor's medical assistant. If no one answers, please leave a voicemail as directed and we will return your call as soon as possible. Messages left after 4 pm will be answered the following business day.   You may also send us a message via MyChart. We typically respond to MyChart messages within 1-2 business days.  For prescription refills, please ask your pharmacy to contact our office. Our fax number is [phone].  If you have an urgent issue when the clinic is closed that cannot wait until the next business day, you can page your doctor at the number below.    Please note that while we do our best to be available for urgent issues outside of office hours, we are not  available 24/7.   If you have an urgent issue and are unable to reach us, you may choose to seek medical care at your doctor's office, retail clinic, urgent care center, or emergency room.  If you have a medical emergency, please immediately call 911 or go to the emergency department.  Pager Numbers  - Dr. Kowalski: [phone]  - Dr. Moye: [phone]  - Dr. Stewart: [phone]  In the event of inclement weather, please call our main line at [phone] for an update on the status of any delays or closures.  Dermatology Medication Tips: Please keep the boxes that topical medications come in in order to help keep track of the instructions about where and how to use these. Pharmacies typically print the medication instructions only on the boxes and not directly on the medication tubes.   If your medication is too expensive, please contact our office at [phone] option 4 or send us a message through MyChart.   We are unable to tell what your co-pay for medications will be in advance as this is different depending on your insurance coverage. However, we may be able to find a substitute medication at lower cost or fill out paperwork to get insurance to cover a needed medication.   If a prior authorization is required to get your medication covered by your insurance company, please allow us 1-2 business days to complete this process.  Drug prices often vary depending on where the prescription is   filled and some pharmacies may offer cheaper prices.  The website www.goodrx.com contains coupons for medications through different pharmacies. The prices here do not account for what the cost may be with help from insurance (it may be cheaper with your insurance), but the website can give you the price if you did not use any insurance.  - You can print the associated coupon and take it with your prescription to the pharmacy.  - You may also stop by our office during regular business hours  and pick up a GoodRx coupon card.  - If you need your prescription sent electronically to a different pharmacy, notify our office through [place] MyChart or by phone at [phone] option 4.     Si Usted Necesita Algo Despus de Su Visita  Tambin puede enviarnos un mensaje a travs de MyChart. Por lo general respondemos a los mensajes de MyChart en el transcurso de 1 a 2 das hbiles.  Para renovar recetas, por favor pida a su farmacia que se ponga en contacto con nuestra oficina. Nuestro nmero de fax es el [phone].  Si tiene un asunto urgente cuando la clnica est cerrada y que no puede esperar hasta el siguiente da hbil, puede llamar/localizar a su doctor(a) al nmero que aparece a continuacin.   Por favor, tenga en cuenta que aunque hacemos todo lo posible para estar disponibles para asuntos urgentes fuera del horario de oficina, no estamos disponibles las 24 horas del da, los 7 das de la semana.   Si tiene un problema urgente y no puede comunicarse con nosotros, puede optar por buscar atencin mdica  en el consultorio de su doctor(a), en una clnica privada, en un centro de atencin urgente o en una sala de emergencias.  Si tiene una emergencia mdica, por favor llame inmediatamente al 911 o vaya a la sala de emergencias.  Nmeros de bper  - Dr. Kowalski: [phone]  - Dra. Moye: [phone]  - Dra. Stewart: [phone]  En caso de inclemencias del tiempo, por favor llame a nuestra lnea principal al [phone] para una actualizacin sobre el estado de cualquier retraso o cierre.  Consejos para la medicacin en dermatologa: Por favor, guarde las cajas en las que vienen los medicamentos de uso tpico para ayudarle a seguir las instrucciones sobre dnde y cmo usarlos. Las farmacias generalmente imprimen las instrucciones del medicamento slo en las cajas y no directamente en los tubos del medicamento.   Si su medicamento es muy caro, por favor, pngase  en contacto con nuestra oficina llamando al [phone] y presione la opcin 4 o envenos un mensaje a travs de MyChart.   No podemos decirle cul ser su copago por los medicamentos por adelantado ya que esto es diferente dependiendo de la cobertura de su seguro. Sin embargo, es posible que podamos encontrar un medicamento sustituto a menor costo o llenar un formulario para que el seguro cubra el medicamento que se considera necesario.   Si se requiere una autorizacin previa para que su compaa de seguros cubra su medicamento, por favor permtanos de 1 a 2 das hbiles para completar este proceso.  Los precios de los medicamentos varan con frecuencia dependiendo del lugar de dnde se surte la receta y alguna farmacias pueden ofrecer precios ms baratos.  El sitio web www.goodrx.com tiene cupones para medicamentos de diferentes farmacias. Los precios aqu no tienen en cuenta lo que podra costar con la ayuda del seguro (puede ser ms barato con su seguro), pero el sitio web puede darle   el precio si no utiliz ningn seguro.  - Puede imprimir el cupn correspondiente y llevarlo con su receta a la farmacia.  - Tambin puede pasar por nuestra oficina durante el horario de atencin regular y recoger una tarjeta de cupones de GoodRx.  - Si necesita que su receta se enve electrnicamente a una farmacia diferente, informe a nuestra oficina a travs de MyChart de [place] o por telfono llamando al [phone] y presione la opcin 4.  

## 2023-02-14 NOTE — Progress Notes (Signed)
   Follow-Up Visit   Subjective  George Rodriguez is a 32 y.o. male who presents for the following: Atopic Dermatitis, arms, face, back, 34m f/u, Dupixent sq injections q 2 wks, ran out of Opzelura cream, Clobetasol foam, no hx of eye irritation.  Not clear because he always worsens in summer, and he has missed a few doses due to traveling and hospitalization for diverticulitis   The following portions of the chart were reviewed this encounter and updated as appropriate: medications, allergies, medical history  Review of Systems:  No other skin or systemic complaints except as noted in HPI or Assessment and Plan.  Objective  Well appearing patient in no apparent distress; mood and affect are within normal limits.  Areas Examined: Face, arms, back  Relevant physical exam findings are noted in the Assessment and Plan.    Assessment & Plan    ATOPIC DERMATITIS Face, arms, back Exam:  lichenified plaques bil antecubitum, forearms, pink scaly patches face, post neck 6% BSA  Chronic and persistent condition with duration or expected duration over one year. Condition is symptomatic/ bothersome to patient. Not currently at goal.   Atopic dermatitis - Severe, on Dupixent (biologic medication).  Atopic dermatitis (eczema) is a chronic, relapsing, pruritic condition that can significantly affect quality of life. It is often associated with allergic rhinitis and/or asthma and can require treatment with topical medications, phototherapy, or in severe cases biologic medications, which require long term medication management.    Treatment Plan: Cont Dupixent 300mg /35ml sq injections q 2 wks Restart Clobetasol foam daily to bid to aa more severe eczema on body for up to 2 weeks, then prn flares, avoid face,groin,axilla Restart Opzelura cr daily to bid to eczema on face and body prn flares  Discussed Rinvoq, may consider if not improving with Dupixent.  Dupilumab (Dupixent) is a treatment given  by injection for adults and children with moderate-to-severe atopic dermatitis. Goal is control of skin condition, not cure. It is given as 2 injections at the first dose followed by 1 injection ever 2 weeks thereafter.  Young children are dosed monthly.  Potential side effects include allergic reaction, herpes infections, injection site reactions and conjunctivitis (inflammation of the eyes).  The use of Dupixent requires long term medication management, including periodic office visits.   Topical steroids (such as triamcinolone, fluocinolone, fluocinonide, mometasone, clobetasol, halobetasol, betamethasone, hydrocortisone) can cause thinning and lightening of the skin if they are used for too long in the same area. Your physician has selected the right strength medicine for your problem and area affected on the body. Please use your medication only as directed by your physician to prevent side effects.    Recommend gentle skin care.   Return in about 6 months (around 08/17/2023) for Atopic Derm.  I, Ardis Rowan, RMA, am acting as scribe for Willeen Niece, MD .   Documentation: I have reviewed the above documentation for accuracy and completeness, and I agree with the above.  Willeen Niece, MD

## 2023-02-18 ENCOUNTER — Telehealth: Payer: Self-pay | Admitting: Internal Medicine

## 2023-02-18 NOTE — Telephone Encounter (Signed)
Patient called to follow up stated he still has no prep medication. Asked for the medication to be available for pick up by Monday.

## 2023-02-18 NOTE — Telephone Encounter (Signed)
Plenvu sample put up front for patient to pick up Monday

## 2023-02-18 NOTE — Telephone Encounter (Signed)
Patient called in requesting to speak with a nurse in regards prep-kit for procedure on 7/17.. patient stated that his  insurance don't cover medication want to know if he can get something different .Please advise

## 2023-02-21 NOTE — Telephone Encounter (Signed)
Inbound call from patient stating he will not make it in time to pick up prep medication. States his father George Rodriguez will be coming to pick medication up for him.

## 2023-02-21 NOTE — Telephone Encounter (Signed)
Prep is up front with Judeth Cornfield.  Father will pick up

## 2023-02-23 ENCOUNTER — Encounter: Payer: Self-pay | Admitting: Internal Medicine

## 2023-02-23 ENCOUNTER — Ambulatory Visit (AMBULATORY_SURGERY_CENTER): Payer: BC Managed Care – PPO | Admitting: Internal Medicine

## 2023-02-23 VITALS — BP 120/54 | HR 89 | Temp 98.0°F | Resp 12 | Ht 71.0 in | Wt 249.0 lb

## 2023-02-23 DIAGNOSIS — Z01818 Encounter for other preprocedural examination: Secondary | ICD-10-CM

## 2023-02-23 DIAGNOSIS — Z933 Colostomy status: Secondary | ICD-10-CM | POA: Diagnosis not present

## 2023-02-23 DIAGNOSIS — K5720 Diverticulitis of large intestine with perforation and abscess without bleeding: Secondary | ICD-10-CM

## 2023-02-23 DIAGNOSIS — R935 Abnormal findings on diagnostic imaging of other abdominal regions, including retroperitoneum: Secondary | ICD-10-CM

## 2023-02-23 MED ORDER — SODIUM CHLORIDE 0.9 % IV SOLN: 500.0000 mL | Freq: Once | INTRAVENOUS | Status: DC

## 2023-02-23 NOTE — Patient Instructions (Signed)
Repeat colonoscopy at age 32. Return to Dr. Doylene Canard YOU HAD AN ENDOSCOPIC PROCEDURE TODAY AT THE [facility]:   Refer to the procedure report that was given to you for any specific questions about what was found during the examination.  If the procedure report does not answer your questions, please call your gastroenterologist to clarify.  If you requested that your care partner not be given the details of your procedure findings, then the procedure report has been included in a sealed envelope for you to review at your convenience later.  YOU SHOULD EXPECT: Some feelings of bloating in the abdomen. Passage of more gas than usual.  Walking can help get rid of the air that was put into your GI tract during the procedure and reduce the bloating. If you had a lower endoscopy (such as a colonoscopy or flexible sigmoidoscopy) you may notice spotting of blood in your stool or on the toilet paper. If you underwent a bowel prep for your procedure, you may not have a normal bowel movement for a few days.  Please Note:  You might notice some irritation and congestion in your nose or some drainage.  This is from the oxygen used during your procedure.  There is no need for concern and it should clear up in a day or so.  SYMPTOMS TO REPORT IMMEDIATELY:  Following lower endoscopy (colonoscopy or flexible sigmoidoscopy):  Excessive amounts of blood in the stool  Significant tenderness or worsening of abdominal pains  Swelling of the abdomen that is new, acute  Fever of 100F or higher  For urgent or emergent issues, a gastroenterologist can be reached at any hour by calling [phone]. Do not use MyChart messaging for urgent concerns.    DIET:  We do recommend a small meal at first, but then you may proceed to your regular diet.  Drink plenty of fluids but you should avoid alcoholic beverages for 24 hours.  ACTIVITY:  You should plan to take it easy for the rest of today and you should NOT  DRIVE or use heavy machinery until tomorrow (because of the sedation medicines used during the test).    FOLLOW UP: Our staff will call the number listed on your records the next business day following your procedure.  We will call around 7:15- 8:00 am to check on you and address any questions or concerns that you may have regarding the information given to you following your procedure. If we do not reach you, we will leave a message.     If any biopsies were taken you will be contacted by phone or by letter within the next 1-3 weeks.  Please call us at [phone] if you have not heard about the biopsies in 3 weeks.    SIGNATURES/CONFIDENTIALITY: You and/or your care partner have signed paperwork which will be entered into your electronic medical record.  These signatures attest to the fact that that the information above on your After Visit Summary has been reviewed and is understood.  Full responsibility of the confidentiality of this discharge information lies with you and/or your care-partner.

## 2023-02-23 NOTE — Progress Notes (Signed)
 Vitals-DT  Pt's states no medical or surgical changes since previsit or office visit.  

## 2023-02-23 NOTE — Progress Notes (Signed)
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  He presents today, with his wife, regarding the need for colonoscopy after suffering perforated diverticulitis requiring emergent surgery with colostomy formation and Hartmann pouch.  Laboratories and x-rays reviewed.  His procedure was performed by Dr. Doylene Canard October 01, 2022.  Reviewed.  He was hospitalized for 11 days.  Since discharge she has done well.  He has had prior appendectomy.  There may be a family history of colon cancer in a great grandparent, though uncertain.  He has not had prior GI evaluations.  No bleeding or mucus per rectum.  GI review of systems is otherwise negative   REVIEW OF SYSTEMS:   All non-GI ROS negative except for sinus and allergy trouble, itching, eczema, blood in urine       Past Medical History:  Diagnosis Date   Anxiety     Asthma     Dermatitis     Diverticulitis     Eczema     HTN (hypertension)                 Past Surgical History:  Procedure Laterality Date   APPENDECTOMY       COLON RESECTION SIGMOID   10/01/2022    Procedure: COLON RESECTION SIGMOID;  Surgeon: Berna Bue, MD;  Location: [facility] OR;  Service: General;;   COLOSTOMY   10/01/2022    Procedure: COLOSTOMY;  Surgeon: Berna Bue, MD;  Location: [facility] OR;  Service: General;;   LAPAROTOMY N/A 10/01/2022    Procedure: EXPLORATORY LAPAROTOMY;  Surgeon: Berna Bue, MD;  Location: [facility] OR;  Service: General;  Laterality: N/A;          Social History George D Minniefield  reports that he has never smoked. His smokeless tobacco use includes chew. He reports current alcohol use of about 16.0 standard drinks of alcohol per week. He reports that he does not use drugs.   family history includes Alcohol abuse in his paternal grandfather; Colon cancer in his paternal grandmother and paternal great-grandmother; Heart attack in his paternal grandmother;  Hyperlipidemia in his mother; Hypertension in his father.   Allergies  No Known Allergies         PHYSICAL EXAMINATION: Vital signs: BP 134/86 (BP Location: Left Arm, Patient Position: Sitting, Cuff Size: Large)   Pulse 76   Ht 5' 11.5" (1.816 m) Comment: height measured without shoes  Wt 249 lb 8 oz (113.2 kg)   BMI 34.31 kg/m   Constitutional: generally well-appearing, no acute distress Psychiatric: alert and oriented x3, cooperative Eyes: extraocular movements intact, anicteric, conjunctiva pink Mouth: oral pharynx moist, no lesions Neck: supple no lymphadenopathy Cardiovascular: heart regular rate and rhythm, no murmur Lungs: clear to auscultation bilaterally Abdomen: soft, nontender, nondistended, no obvious ascites, no peritoneal signs, normal bowel sounds, no organomegaly.  Ostomy intact Rectal: Deferred until colonoscopy Extremities: no clubbing, cyanosis, or lower extremity edema bilaterally Skin: no lesions on visible extremities Neuro: No focal deficits.  Cranial nerves intact   ASSESSMENT:   1.  Perforated sigmoid diverticulitis with subsequent urgent laparotomy with colostomy and Hartmann pouch formation.  Has recovered nicely.  Anticipating reversal.  Colonoscopy requested     PLAN:   1.  Colonoscopy per ostomy and per rectum.The nature of the procedure, as well as the risks, benefits, and alternatives were carefully and thoroughly reviewed with the patient. Ample time for discussion and  questions allowed. The patient understood, was satisfied, and agreed to proceed. 2.  Follow-up with general surgery after colonoscopy completed

## 2023-02-23 NOTE — Progress Notes (Signed)
Vss nad trans to pacu 

## 2023-02-23 NOTE — Op Note (Signed)
[facility] Patient Name: George Rodriguez Procedure Date: 02/23/2023 2:15 PM MRN: [id number] Endoscopist: Wilhemina Bonito. Marina Goodell , MD, [id number] Age: 32 Referring MD:  Date of Birth: 1991/04/28 Gender: Male Account #: [id number] Procedure:                Colonoscopy the ostomy and per rectum Indications:              Abnormal CT of the GI tract. History of perforated                            diverticulitis status post colostomy and Hartmann                            pouch. Now for preop evaluation anticipating                            reversal of his ostomy Medicines:                Monitored Anesthesia Care Procedure:                Pre-Anesthesia Assessment:                           - Prior to the procedure, a History and Physical                            was performed, and patient medications and                            allergies were reviewed. The patient's tolerance of                            previous anesthesia was also reviewed. The risks                            and benefits of the procedure and the sedation                            options and risks were discussed with the patient.                            All questions were answered, and informed consent                            was obtained. Prior Anticoagulants: The patient has                            taken no anticoagulant or antiplatelet agents. ASA                            Grade Assessment: II - A patient with mild systemic                            disease. After reviewing the risks and benefits,  the patient was deemed in satisfactory condition to                            undergo the procedure.                           After obtaining informed consent, the colonoscope                            was passed under direct vision. Throughout the                            procedure, the patient's blood pressure, pulse, and                            oxygen saturations  were monitored continuously. The                            PCF-H190TL Slim SN [id number] was introduced through                            the ostomy and advanced to the the cecum,                            identified by appendiceal orifice and ileocecal                            valve. The ileocecal valve, appendiceal orifice,                            and rectum were photographed. The quality of the                            bowel preparation was good. The colonoscopy was                            performed without difficulty. The scope was then                            passed through the anus to the end of the ostomy                            pouch. The preparation was good except for the most                            distal rectum which had moderate residual stool.                            The patient tolerated the procedure well. The bowel                            preparation used was SUPREP via split dose  instruction. Scope In: 2:21:50 PM Scope Out: 2:33:30 PM Scope Withdrawal Time: 0 hours 9 minutes 10 seconds  Total Procedure Duration: 0 hours 11 minutes 40 seconds  Findings:                 The entire examined colon, from the ostomy to                            cecum, appeared normal.                           The area from the rectum to [facility] pouch was                            unremarkable. Complications:            No immediate complications. Estimated blood loss:                            None. Estimated Blood Loss:     Estimated blood loss: none. Impression:               - The entire examined colon via the ostomy and via                            the anus was unremarkable.                           - No specimens collected. Recommendation:           - Repeat colonoscopy at age 89 for screening                            purposes.                           - Patient has a contact number available for                             emergencies. The signs and symptoms of potential                            delayed complications were discussed with the                            patient. Return to normal activities tomorrow.                            Written discharge instructions were provided to the                            patient.                           - Resume previous diet.                           - Continue present medications.                           -  Return to your surgeon, Dr. Doylene Canard. Wilhemina Bonito. Marina Goodell, MD 02/23/2023 2:42:21 PM This report has been signed electronically.

## 2023-02-24 ENCOUNTER — Telehealth: Payer: Self-pay | Admitting: *Deleted

## 2023-02-24 NOTE — Telephone Encounter (Signed)
  Follow up Call-     02/23/2023    1:36 PM 02/23/2023    1:32 PM  Call back number  Post procedure Call Back phone  # [phone]   Permission to leave phone message  Yes     Patient questions:  Do you have a fever, pain , or abdominal swelling? No. Pain Score  0 *  Have you tolerated food without any problems? Yes.    Have you been able to return to your normal activities? Yes.    Do you have any questions about your discharge instructions: Diet   No. Medications  No. Follow up visit  No.  Do you have questions or concerns about your Care? No.  Actions: * If pain score is 4 or above: No action needed, pain <4.

## 2023-03-02 ENCOUNTER — Ambulatory Visit: Payer: Self-pay | Admitting: Surgery

## 2023-03-02 NOTE — H&P (Signed)
George Rodriguez [id number]  DATE OF ENCOUNTER: 03/02/2023 Interval History:   Returns for scheduled follow-up.  He has completed his colonoscopy as below with Dr. Marina Goodell, no lesions were present and the prep was good.  He has gotten married, they did end up going to the [place] and had a great time.  He is ready to discuss colostomy reversal.  Last visit: Returns for scheduled follow-up.  Has continued to do well.  He is back to work, staying active, his appetite has returned and he has actually gained about 12 pounds since our last visit.  Ostomy is functioning well.  He did lift some heavy things recently and had some pain in adjacent to his ostomy, his fiance noted a bulge there which has retracted.  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 He has done well. Some issues with his ostomy leaking and peristomal skin breakdown, followed up with the ostomy clinic and made some changes which have worked really well.  He is going to be getting a Stealth belt soon.  He is eager to get back to work.  Their wedding in June in the [place] is coming up, but with issues currently going on in [place] they are a little worried about this.  He presented on 2/19 with a 2-day history of left lower quadrant abdominal pain and some reported hematuria and dysuria.  Is associated with some anorexia but no emesis.  No prior similar episodes.  Previous abdominal surgery included laparoscopic appendectomy at [facility] complicated by evisceration (?) requiring repair on postop day 1.  He was found to have diverticulitis with microperforation and mesenteric abscess which was not amenable to percutaneous drainage.  Admitted to the hospital and started on IV antibiotic therapy and bowel rest.  His symptoms did initially improve in the first 24 hours however on day 3 of his hospital stay he had progressively worsening  pain, distention, nausea fevers and leukocytosis.  A follow-up CT demonstrated worsening progression of inflammatory changes in extraluminal air.  Subsequently he underwent exploratory laparotomy with sigmoid colectomy and end colostomy on 2/23 which he tolerated well.  He had a mild ileus postoperatively but this resolved within a couple days.  Ultimately he was discharged tolerating regular diet having good ostomy function and stable abdominal wound.  Physical Examination:   There were no vitals filed for this visit.   Alert, well-appearing Unlabored respirations Abdomen is soft, nontender.  Not visible through appliance today.  Midline wound is healed, completely epithelialized at this point.  No palpable incisional hernia.     Assessment and Plan:   Colostomy care (CMS/HHS-HCC)  (primary encounter diagnosis) Plan: polyethylene glycol (MIRALAX) powder, bisacodyL       (DULCOLAX) 5 mg EC tablet, metroNIDAZOLE        (FLAGYL) 500 MG tablet, neomycin 500 mg tablet  Status post Hartmann procedure (CMS/HHS-HCC)   He has recovered well and has completed colonoscopy which fortunately was negative.  Will plan to proceed with laparoscopic colostomy reversal, possible open.  We discussed the procedure and risks of bleeding, infection, pain, scarring, injury to intra-abdominal or retroperitoneal structures, anastomotic leak/bleed/stricture, need for return to the OR and diverting loop ileostomy, ileus, obstruction, wound healing problems/incisional hernia or recurrence of parastomal hernia.  Also went over cardiovascular/pulmonary/thromboembolic complications.  Discussed typical postop recovery timeline.  Questions were welcomed and answered to his satisfaction.  We discussed the prep which she will do the  day before surgery and he was given an informational handout along with the prescriptions.  Will proceed with scheduling.  10/01/22: Procedure performed: exploratory laparotomy, sigmoid partial  colectomy with end colostomy   Preop diagnosis: perforated diverticulitis Post-op diagnosis/intraop findings: Same, perforation into the mesentery with abscess; extensive inflammatory change along the sigmoid mesentery, developing ileus  George Morais Carlye Grippe, MD

## 2023-04-13 NOTE — Progress Notes (Addendum)
Anesthesia Review:  PCP: DR Laural Benes with [facility] in [place]  Cardiologist : none  Chest x-ray : EKG : 04/27/23  Echo : Stress test: Cardiac Cath :  Activity level: can do a flight of stairs without difficutly  Sleep Study/ CPAP : none  Fasting Blood Sugar :      / Checks Blood Sugar -- times a day:   Blood Thinner/ Instructions /Last Dose: ASA / Instructions/ Last Dose :   CBC done 04/27/23 routed to DR Fredricka Bonine with hgb of 17.5 on 04/27/23.    AT time of preop appt on 04/27/23 spouse with pt.  Med hx and preop instructions completed.  Initial blood pressure at preop appt at 1425pm was 159/117 in left arm.  And in rihgt arm was 175/120.  PT denies any chest pain, shortness of breath, dizziness , headache or blurred vision.  LEft arm recheck was 152/110.  PT did admit after blood pressure recheck that he has had 2 episodes of chest pain one on 04/26/23 and one on 04/25/23.  Lasting approximately 15 minutes each at nite with diaphoresis.  Wife was unaware.  Wife and pt report that he has been anxious about this surgery and does have anxiety.  PT blood drawn and pt given water to drihk.  [place] aware.  AT 1440pm blood pressure was rechecked and was 168/99 in right arm with pulse of 89 and in left arm was 153/96 with pulse of 88.  EK"G done Shows Sinus Rhythm.  Terance [facility] aware.  PT was instructed to notify PCP today and to be evaluated as soon as possible.  PT and wife voiced understanding.  Wife has blood pressure cuff at home and pt was inststructed to check blood pressure daily at home.  Pt and wife voiced understanding.  PT upon discharge states he was calling PCP and set up appt prior to surgery .  PT denies any chest pain, shortness of breath, dizziness, headache or blurred vision at time of discharge.

## 2023-04-13 NOTE — Patient Instructions (Addendum)
SURGICAL WAITING ROOM VISITATION  Patients having surgery or a procedure may have no more than 2 support people in the waiting area - these visitors may rotate.    Children under the age of 71 must have an adult with them who is not the patient.  Due to an increase in RSV and influenza rates and associated hospitalizations, children ages 11 and under may not visit patients in [facility] hospitals.  If the patient needs to stay at the hospital during part of their recovery, the visitor guidelines for inpatient rooms apply. Pre-op nurse will coordinate an appropriate time for 1 support person to accompany patient in pre-op.  This support person may not rotate.    Please refer to the [facility] website for the visitor guidelines for Inpatients (after your surgery is over and you are in a regular room).       Your procedure is scheduled on:  05/13/2023    Report to [facility] Main Entrance    Report to admitting at  0515 AM   Call this number if you have problems the morning of surgery [phone]   Do not eat food :After Midnight.   After Midnight you may have the following liquids until _ 0430_____ AM  DAY OF SURGERY  Water Non-Citrus Juices (without pulp, NO RED-Apple, White grape, White cranberry) Black Coffee (NO MILK/CREAM OR CREAMERS, sugar ok)  Clear Tea (NO MILK/CREAM OR CREAMERS, sugar ok) regular and decaf                             Plain Jell-O (NO RED)                                           Fruit ices (not with fruit pulp, NO RED)                                     Popsicles (NO RED)                                                               Sports drinks like Gatorade (NO RED)              Drink 2 Ensure/G2 drinks AT 10:00 PM the night before surgery.        The day of surgery:  Drink ONE (1) Pre-Surgery Clear Ensure or G2 at  0430 AM  ( have completed by ) the morning of surgery. Drink in one sitting. Do not sip.  This drink was given to you  during your hospital  pre-op appointment visit. Nothing else to drink after completing the  Pre-Surgery Clear Ensure or G2.          If you have questions, please contact your surgeon's office.   FOLLOW BOWEL PREP AND ANY ADDITIONAL PRE OP INSTRUCTIONS YOU RECEIVED FROM YOUR SURGEON'S OFFICE!!!     Oral Hygiene is also important to reduce your risk of infection.  Remember - BRUSH YOUR TEETH THE MORNING OF SURGERY WITH YOUR REGULAR TOOTHPASTE  DENTURES WILL BE REMOVED PRIOR TO SURGERY PLEASE DO NOT APPLY "Poly grip" OR ADHESIVES!!!   Do NOT smoke after Midnight   Stop all vitamins and herbal supplements 7 days before surgery.   Take these medicines the morning of surgery with A SIP OF WATER:  inhalers as  usual and bring, flonase if needed   DO NOT TAKE ANY ORAL DIABETIC MEDICATIONS DAY OF YOUR SURGERY  Bring CPAP mask and tubing day of surgery.                              You may not have any metal on your body including hair pins, jewelry, and body piercing             Do not wear make-up, lotions, powders, perfumes/cologne, or deodorant  Do not wear nail polish including gel and S&S, artificial/acrylic nails, or any other type of covering on natural nails including finger and toenails. If you have artificial nails, gel coating, etc. that needs to be removed by a nail salon please have this removed prior to surgery or surgery may need to be canceled/ delayed if the surgeon/ anesthesia feels like they are unable to be safely monitored.   Do not shave  48 hours prior to surgery.               Men may shave face and neck.   Do not bring valuables to the hospital. [facility] IS NOT             RESPONSIBLE   FOR VALUABLES.   Contacts, glasses, dentures or bridgework may not be worn into surgery.   Bring small overnight bag day of surgery.   DO NOT BRING YOUR HOME MEDICATIONS TO THE HOSPITAL. PHARMACY WILL DISPENSE MEDICATIONS LISTED ON YOUR  MEDICATION LIST TO YOU DURING YOUR ADMISSION IN THE HOSPITAL!    Patients discharged on the day of surgery will not be allowed to drive home.  Someone NEEDS to stay with you for the first 24 hours after anesthesia.   Special Instructions: Bring a copy of your healthcare power of attorney and living will documents the day of surgery if you haven't scanned them before.              Please read over the following fact sheets you were given: IF YOU HAVE QUESTIONS ABOUT YOUR PRE-OP INSTRUCTIONS PLEASE CALL [phone]   If you received a COVID test during your pre-op visit  it is requested that you wear a mask when out in public, stay away from anyone that may not be feeling well and notify your surgeon if you develop symptoms. If you test positive for Covid or have been in contact with anyone that has tested positive in the last 10 days please notify you surgeon.    [facility] - Preparing for Surgery Before surgery, you can play an important role.  Because skin is not sterile, your skin needs to be as free of germs as possible.  You can reduce the number of germs on your skin by washing with CHG (chlorahexidine gluconate) soap before surgery.  CHG is an antiseptic cleaner which kills germs and bonds with the skin to continue killing germs even after washing. Please DO NOT use if you have an allergy to CHG or antibacterial soaps.  If your skin becomes reddened/irritated stop using  the CHG and inform your nurse when you arrive at Short Stay. Do not shave (including legs and underarms) for at least 48 hours prior to the first CHG shower.  You may shave your face/neck. Please follow these instructions carefully:  1.  Shower with CHG Soap the night before surgery and the  morning of Surgery.  2.  If you choose to wash your hair, wash your hair first as usual with your  normal  shampoo.  3.  After you shampoo, rinse your hair and body thoroughly to remove the  shampoo.                           4.  Use CHG  as you would any other liquid soap.  You can apply chg directly  to the skin and wash                       Gently with a scrungie or clean washcloth.  5.  Apply the CHG Soap to your body ONLY FROM THE NECK DOWN.   Do not use on face/ open                           Wound or open sores. Avoid contact with eyes, ears mouth and genitals (private parts).                       Wash face,  Genitals (private parts) with your normal soap.             6.  Wash thoroughly, paying special attention to the area where your surgery  will be performed.  7.  Thoroughly rinse your body with warm water from the neck down.  8.  DO NOT shower/wash with your normal soap after using and rinsing off  the CHG Soap.                9.  Pat yourself dry with a clean towel.            10.  Wear clean pajamas.            11.  Place clean sheets on your bed the night of your first shower and do not  sleep with pets. Day of Surgery : Do not apply any lotions/deodorants the morning of surgery.  Please wear clean clothes to the hospital/surgery center.  FAILURE TO FOLLOW THESE INSTRUCTIONS MAY RESULT IN THE CANCELLATION OF YOUR SURGERY PATIENT SIGNATURE_________________________________  NURSE SIGNATURE__________________________________  ________________________________________________________________________

## 2023-04-27 ENCOUNTER — Encounter (HOSPITAL_COMMUNITY): Payer: Self-pay

## 2023-04-27 ENCOUNTER — Encounter (HOSPITAL_COMMUNITY)
Admission: RE | Admit: 2023-04-27 | Discharge: 2023-04-27 | Disposition: A | Discharge status: Home / Self Care | Payer: BC Managed Care – PPO | Source: Ambulatory Visit | Attending: Surgery | Admitting: Surgery

## 2023-04-27 ENCOUNTER — Other Ambulatory Visit: Payer: Self-pay

## 2023-04-27 VITALS — BP 159/117 | HR 105 | Temp 97.9°F | Resp 16 | Ht 72.0 in

## 2023-04-27 DIAGNOSIS — Z01818 Encounter for other preprocedural examination: Secondary | ICD-10-CM | POA: Diagnosis present

## 2023-04-27 LAB — CBC
HCT: 50.9 % (ref 39.0–52.0)
Hemoglobin: 17.5 g/dL — ABNORMAL HIGH (ref 13.0–17.0)
MCH: 31.1 pg (ref 26.0–34.0)
MCHC: 34.4 g/dL (ref 30.0–36.0)
MCV: 90.6 fL (ref 80.0–100.0)
Platelets: 318 10*3/uL (ref 150–400)
RBC: 5.62 MIL/uL (ref 4.22–5.81)
RDW: 12.3 % (ref 11.5–15.5)
WBC: 7.7 10*3/uL (ref 4.0–10.5)
nRBC: 0 % (ref 0.0–0.2)

## 2023-04-27 LAB — BASIC METABOLIC PANEL WITH GFR
Anion gap: 12 (ref 5–15)
BUN: 13 mg/dL (ref 6–20)
CO2: 21 mmol/L — ABNORMAL LOW (ref 22–32)
Calcium: 9.4 mg/dL (ref 8.9–10.3)
Chloride: 106 mmol/L (ref 98–111)
Creatinine, Ser: 0.91 mg/dL (ref 0.61–1.24)
GFR, Estimated: >60 mL/min (ref >60)
Glucose, Bld: 107 mg/dL — ABNORMAL HIGH (ref 70–99)
Potassium: 3.7 mmol/L (ref 3.5–5.1)
Sodium: 139 mmol/L (ref 135–145)

## 2023-04-27 LAB — TYPE AND SCREEN
ABO/RH(D): B POS
Antibody Screen: NEGATIVE

## 2023-05-05 ENCOUNTER — Ambulatory Visit: Payer: BC Managed Care – PPO | Admitting: Family Medicine

## 2023-05-05 ENCOUNTER — Encounter: Payer: Self-pay | Admitting: Family Medicine

## 2023-05-05 VITALS — BP 152/92 | HR 96 | Temp 97.8°F | Ht 71.5 in | Wt 265.6 lb

## 2023-05-05 DIAGNOSIS — R0683 Snoring: Secondary | ICD-10-CM | POA: Diagnosis not present

## 2023-05-05 DIAGNOSIS — I10 Essential (primary) hypertension: Secondary | ICD-10-CM

## 2023-05-05 DIAGNOSIS — Z136 Encounter for screening for cardiovascular disorders: Secondary | ICD-10-CM

## 2023-05-05 DIAGNOSIS — F419 Anxiety disorder, unspecified: Secondary | ICD-10-CM

## 2023-05-05 DIAGNOSIS — Z1159 Encounter for screening for other viral diseases: Secondary | ICD-10-CM

## 2023-05-05 HISTORY — DX: Anxiety disorder, unspecified: F41.9

## 2023-05-05 HISTORY — DX: Essential (primary) hypertension: I10

## 2023-05-05 LAB — MICROALBUMIN, URINE WAIVED
Creatinine, Urine Waived: 200 mg/dL (ref 10–300)
Microalb, Ur Waived: 30 mg/L — ABNORMAL HIGH (ref 0–19)
Microalb/Creat Ratio: <30 mg/g (ref <30)

## 2023-05-05 MED ORDER — LISINOPRIL 10 MG PO TABS: 10.0000 mg | ORAL_TABLET | Freq: Every day | ORAL | 3 refills | Status: DC

## 2023-05-05 MED ORDER — SERTRALINE HCL 50 MG PO TABS: ORAL_TABLET | ORAL | 3 refills | Status: DC

## 2023-05-05 MED ORDER — HYDROXYZINE PAMOATE 25 MG PO CAPS: 25.0000 mg | ORAL_CAPSULE | Freq: Three times a day (TID) | ORAL | 1 refills | Status: AC | PRN

## 2023-05-06 ENCOUNTER — Encounter: Payer: Self-pay | Admitting: Family Medicine

## 2023-05-06 DIAGNOSIS — E785 Hyperlipidemia, unspecified: Secondary | ICD-10-CM | POA: Insufficient documentation

## 2023-05-06 LAB — COMPREHENSIVE METABOLIC PANEL
ALT: 75 [IU]/L — ABNORMAL HIGH (ref 0–44)
AST: 38 [IU]/L (ref 0–40)
Albumin: 5 g/dL (ref 4.1–5.1)
Alkaline Phosphatase: 92 [IU]/L (ref 44–121)
BUN/Creatinine Ratio: 14 (ref 9–20)
BUN: 12 mg/dL (ref 6–20)
Bilirubin Total: 0.4 mg/dL (ref 0.0–1.2)
CO2: 22 mmol/L (ref 20–29)
Calcium: 9.8 mg/dL (ref 8.7–10.2)
Chloride: 103 mmol/L (ref 96–106)
Creatinine, Ser: 0.86 mg/dL (ref 0.76–1.27)
Globulin, Total: 2.5 g/dL (ref 1.5–4.5)
Glucose: 88 mg/dL (ref 70–99)
Potassium: 4 mmol/L (ref 3.5–5.2)
Sodium: 139 mmol/L (ref 134–144)
Total Protein: 7.5 g/dL (ref 6.0–8.5)
eGFR: 119 mL/min/{1.73_m2} (ref >59)

## 2023-05-06 LAB — CBC WITH DIFFERENTIAL/PLATELET
Basophils Absolute: 0.1 10*3/uL (ref 0.0–0.2)
Basos: 1 %
EOS (ABSOLUTE): 0.7 10*3/uL — ABNORMAL HIGH (ref 0.0–0.4)
Eos: 9 %
Hematocrit: 48.7 % (ref 37.5–51.0)
Hemoglobin: 16.7 g/dL (ref 13.0–17.7)
Immature Grans (Abs): 0.1 10*3/uL (ref 0.0–0.1)
Immature Granulocytes: 1 %
Lymphocytes Absolute: 2.3 10*3/uL (ref 0.7–3.1)
Lymphs: 30 %
MCH: 31.2 pg (ref 26.6–33.0)
MCHC: 34.3 g/dL (ref 31.5–35.7)
MCV: 91 fL (ref 79–97)
Monocytes Absolute: 1 10*3/uL — ABNORMAL HIGH (ref 0.1–0.9)
Monocytes: 13 %
Neutrophils Absolute: 3.4 10*3/uL (ref 1.4–7.0)
Neutrophils: 46 %
Platelets: 317 10*3/uL (ref 150–450)
RBC: 5.35 x10E6/uL (ref 4.14–5.80)
RDW: 11.9 % (ref 11.6–15.4)
WBC: 7.5 10*3/uL (ref 3.4–10.8)

## 2023-05-06 LAB — LIPID PANEL W/O CHOL/HDL RATIO
Cholesterol, Total: 234 mg/dL — ABNORMAL HIGH (ref 100–199)
HDL: 42 mg/dL (ref >39)
LDL Chol Calc (NIH): 152 mg/dL — ABNORMAL HIGH (ref 0–99)
Triglycerides: 217 mg/dL — ABNORMAL HIGH (ref 0–149)
VLDL Cholesterol Cal: 40 mg/dL (ref 5–40)

## 2023-05-06 LAB — HEPATITIS C ANTIBODY: Hep C Virus Ab: NONREACTIVE

## 2023-05-06 LAB — TSH: TSH: 1.81 u[IU]/mL (ref 0.450–4.500)

## 2023-05-08 ENCOUNTER — Encounter: Payer: Self-pay | Admitting: Family Medicine

## 2023-05-08 NOTE — Assessment & Plan Note (Signed)
Not under good control. Will start him on lisinopril and recheck in about a month. Call with any concerns.

## 2023-05-08 NOTE — Assessment & Plan Note (Signed)
Will start him on sertraline and PRN hydroxyzine. Recheck in 4 weeks. Call with any concerns.

## 2023-05-09 NOTE — Progress Notes (Signed)
Anesthesia Chart Review   Case: [id number] Date/Time: 05/13/23 0715   Procedure: LAPAROSCOPIC COLOSTOMY REVERSAL - 240   Anesthesia type: General   Pre-op diagnosis: colostomy   Location: [facility] ROOM 01 / [facility]   Surgeons: Berna Bue, MD       DISCUSSION:32 y.o. never smoker with h/o HTM, asthma, colostomy in place scheduled for above procedure 05/13/2023 with Dr. Phylliss Blakes.   Elevated BP at PAT visit.  Advised to follow up with PCP prior to surgery.    Seen by PCP 05/05/2023. Per OV note HTN uncontrolled.  Lisinopril started. Advised to hold DOS.  VS: There were no vitals taken for this visit.  PROVIDERS: Dorcas Carrow, DO is PCP    LABS: Labs reviewed: Acceptable for surgery. (all labs ordered are listed, but only abnormal results are displayed)  Labs Reviewed - No data to display   IMAGES:   EKG:   CV:  Past Medical History:  Diagnosis Date   Anxiety 05/05/2023   Asthma    Dermatitis    Diverticulitis    Eczema    Primary hypertension 05/05/2023    Past Surgical History:  Procedure Laterality Date   APPENDECTOMY     COLON RESECTION SIGMOID  10/01/2022   Procedure: COLON RESECTION SIGMOID;  Surgeon: Berna Bue, MD;  Location: [facility] OR;  Service: General;;   COLOSTOMY  10/01/2022   Procedure: COLOSTOMY;  Surgeon: Berna Bue, MD;  Location: [facility] OR;  Service: General;;   LAPAROTOMY N/A 10/01/2022   Procedure: EXPLORATORY LAPAROTOMY;  Surgeon: Berna Bue, MD;  Location: [facility] OR;  Service: General;  Laterality: N/A;    MEDICATIONS: No current facility-administered medications for this encounter.    acetaminophen (TYLENOL) 500 MG tablet   albuterol (VENTOLIN HFA) 108 (90 Base) MCG/ACT inhaler   clobetasol (OLUX) 0.05 % topical foam   dupilumab (DUPIXENT) 300 MG/2ML prefilled syringe   Ruxolitinib Phosphate (OPZELURA) 1.5 % CREA   Clobetasol Propionate Emulsion 0.05 % topical foam   hydrOXYzine (VISTARIL) 25 MG capsule   lisinopril  (ZESTRIL) 10 MG tablet   sertraline (ZOLOFT) 50 MG tablet    [facility], PA-C [facility] Pre-Surgical Testing [phone]

## 2023-05-13 ENCOUNTER — Encounter (HOSPITAL_COMMUNITY)
Admission: RE | Disposition: A | Discharge status: Home / Self Care | Payer: Self-pay | Source: Ambulatory Visit | Attending: Surgery

## 2023-05-13 ENCOUNTER — Inpatient Hospital Stay (HOSPITAL_COMMUNITY): Payer: BC Managed Care – PPO | Admitting: Physician Assistant

## 2023-05-13 ENCOUNTER — Inpatient Hospital Stay (HOSPITAL_COMMUNITY)
Admission: RE | Admit: 2023-05-13 | Discharge: 2023-05-15 | DRG: 346 | Disposition: A | Discharge status: Home / Self Care | Payer: BC Managed Care – PPO | Source: Ambulatory Visit | Attending: Surgery | Admitting: Surgery

## 2023-05-13 ENCOUNTER — Other Ambulatory Visit: Payer: Self-pay

## 2023-05-13 ENCOUNTER — Encounter (HOSPITAL_COMMUNITY): Payer: Self-pay | Admitting: Surgery

## 2023-05-13 DIAGNOSIS — J45909 Unspecified asthma, uncomplicated: Secondary | ICD-10-CM | POA: Diagnosis present

## 2023-05-13 DIAGNOSIS — Z79620 Long term (current) use of immunosuppressive biologic: Secondary | ICD-10-CM

## 2023-05-13 DIAGNOSIS — Z9889 Other specified postprocedural states: Principal | ICD-10-CM | POA: Diagnosis present

## 2023-05-13 DIAGNOSIS — Z01818 Encounter for other preprocedural examination: Secondary | ICD-10-CM

## 2023-05-13 DIAGNOSIS — L309 Dermatitis, unspecified: Secondary | ICD-10-CM | POA: Diagnosis present

## 2023-05-13 DIAGNOSIS — Z79899 Other long term (current) drug therapy: Secondary | ICD-10-CM

## 2023-05-13 DIAGNOSIS — Z433 Encounter for attention to colostomy: Principal | ICD-10-CM

## 2023-05-13 DIAGNOSIS — I10 Essential (primary) hypertension: Secondary | ICD-10-CM | POA: Diagnosis present

## 2023-05-13 DIAGNOSIS — Z9049 Acquired absence of other specified parts of digestive tract: Secondary | ICD-10-CM

## 2023-05-13 HISTORY — PX: COLOSTOMY TAKEDOWN: SHX5258

## 2023-05-13 SURGERY — CLOSURE, COLOSTOMY, LAPAROSCOPIC
Anesthesia: General

## 2023-05-13 MED ORDER — ENOXAPARIN SODIUM 40 MG/0.4ML IJ SOSY
40.0000 mg | PREFILLED_SYRINGE | INTRAMUSCULAR | Status: DC
  Administered 2023-05-14 – 2023-05-15 (×2): 40 mg via SUBCUTANEOUS
  Filled 2023-05-13 (×2): qty 0.4

## 2023-05-13 MED ORDER — STERILE WATER FOR IRRIGATION IR SOLN
Status: DC | PRN
Start: 2023-05-13 — End: 2023-05-13
  Administered 2023-05-13: 1000 mL

## 2023-05-13 MED ORDER — SACCHAROMYCES BOULARDII 250 MG PO CAPS
250.0000 mg | ORAL_CAPSULE | Freq: Two times a day (BID) | ORAL | Status: DC
  Administered 2023-05-13 – 2023-05-15 (×5): 250 mg via ORAL
  Filled 2023-05-13 (×5): qty 1

## 2023-05-13 MED ORDER — MELATONIN 3 MG PO TABS: 3.0000 mg | ORAL_TABLET | Freq: Every evening | ORAL | Status: DC | PRN

## 2023-05-13 MED ORDER — SUGAMMADEX SODIUM 200 MG/2ML IV SOLN
INTRAVENOUS | Status: DC | PRN
  Administered 2023-05-13: 200 mg via INTRAVENOUS

## 2023-05-13 MED ORDER — BUPIVACAINE LIPOSOME 1.3 % IJ SUSP: 20.0000 mL | Freq: Once | INTRAMUSCULAR | Status: DC

## 2023-05-13 MED ORDER — DEXAMETHASONE SODIUM PHOSPHATE 10 MG/ML IJ SOLN
INTRAMUSCULAR | Status: DC | PRN
Start: 2023-05-13 — End: 2023-05-13
  Administered 2023-05-13: 8 mg via INTRAVENOUS

## 2023-05-13 MED ORDER — ALBUTEROL SULFATE HFA 108 (90 BASE) MCG/ACT IN AERS
INHALATION_SPRAY | RESPIRATORY_TRACT | Status: AC
  Filled 2023-05-13: qty 6.7

## 2023-05-13 MED ORDER — SODIUM CHLORIDE 0.9 % IV SOLN: INTRAVENOUS | Status: DC

## 2023-05-13 MED ORDER — LACTATED RINGERS IV SOLN: INTRAVENOUS | Status: DC

## 2023-05-13 MED ORDER — KETAMINE HCL 50 MG/5ML IJ SOSY
PREFILLED_SYRINGE | INTRAMUSCULAR | Status: AC
  Filled 2023-05-13: qty 5

## 2023-05-13 MED ORDER — BUPIVACAINE LIPOSOME 1.3 % IJ SUSP
INTRAMUSCULAR | Status: DC | PRN
  Administered 2023-05-13: 50 mL

## 2023-05-13 MED ORDER — MIDAZOLAM HCL 2 MG/2ML IJ SOLN
INTRAMUSCULAR | Status: AC
  Filled 2023-05-13: qty 2

## 2023-05-13 MED ORDER — ALBUTEROL SULFATE HFA 108 (90 BASE) MCG/ACT IN AERS
INHALATION_SPRAY | RESPIRATORY_TRACT | Status: DC | PRN
  Administered 2023-05-13: 2 via RESPIRATORY_TRACT

## 2023-05-13 MED ORDER — ROCURONIUM BROMIDE 100 MG/10ML IV SOLN
INTRAVENOUS | Status: DC | PRN
  Administered 2023-05-13: 100 mg via INTRAVENOUS
  Administered 2023-05-13: 20 mg via INTRAVENOUS

## 2023-05-13 MED ORDER — ALVIMOPAN 12 MG PO CAPS
12.0000 mg | ORAL_CAPSULE | ORAL | Status: AC
  Administered 2023-05-13: 12 mg via ORAL
  Filled 2023-05-13: qty 1

## 2023-05-13 MED ORDER — GABAPENTIN 300 MG PO CAPS
300.0000 mg | ORAL_CAPSULE | ORAL | Status: AC
  Administered 2023-05-13: 300 mg via ORAL
  Filled 2023-05-13: qty 1

## 2023-05-13 MED ORDER — MIDAZOLAM HCL 5 MG/5ML IJ SOLN
INTRAMUSCULAR | Status: DC | PRN
  Administered 2023-05-13: 2 mg via INTRAVENOUS

## 2023-05-13 MED ORDER — BUPIVACAINE HCL (PF) 0.25 % IJ SOLN
INTRAMUSCULAR | Status: AC
  Filled 2023-05-13: qty 30

## 2023-05-13 MED ORDER — CHLORHEXIDINE GLUCONATE 0.12 % MT SOLN
15.0000 mL | Freq: Once | OROMUCOSAL | Status: AC
  Administered 2023-05-13: 15 mL via OROMUCOSAL

## 2023-05-13 MED ORDER — ENSURE PRE-SURGERY PO LIQD
296.0000 mL | Freq: Once | ORAL | Status: DC
  Filled 2023-05-13: qty 296

## 2023-05-13 MED ORDER — FENTANYL CITRATE (PF) 100 MCG/2ML IJ SOLN
INTRAMUSCULAR | Status: DC | PRN
  Administered 2023-05-13 (×4): 50 ug via INTRAVENOUS

## 2023-05-13 MED ORDER — ALUM & MAG HYDROXIDE-SIMETH 200-200-20 MG/5ML PO SUSP: 30.0000 mL | Freq: Four times a day (QID) | ORAL | Status: DC | PRN

## 2023-05-13 MED ORDER — PROPOFOL 10 MG/ML IV BOLUS
INTRAVENOUS | Status: AC
  Filled 2023-05-13: qty 20

## 2023-05-13 MED ORDER — DIPHENHYDRAMINE HCL 50 MG/ML IJ SOLN: 25.0000 mg | Freq: Four times a day (QID) | INTRAMUSCULAR | Status: DC | PRN

## 2023-05-13 MED ORDER — 0.9 % SODIUM CHLORIDE (POUR BTL) OPTIME
TOPICAL | Status: DC | PRN
Start: 2023-05-13 — End: 2023-05-13
  Administered 2023-05-13: 1000 mL

## 2023-05-13 MED ORDER — HYDRALAZINE HCL 20 MG/ML IJ SOLN: 10.0000 mg | INTRAMUSCULAR | Status: DC | PRN

## 2023-05-13 MED ORDER — SODIUM CHLORIDE 0.9 % IV SOLN
2.0000 g | INTRAVENOUS | Status: AC
  Administered 2023-05-13: 2 g via INTRAVENOUS
  Filled 2023-05-13: qty 2

## 2023-05-13 MED ORDER — KETAMINE HCL 10 MG/ML IJ SOLN
INTRAMUSCULAR | Status: DC | PRN
Start: 2023-05-13 — End: 2023-05-13
  Administered 2023-05-13: 10 mg via INTRAVENOUS
  Administered 2023-05-13: 40 mg via INTRAVENOUS

## 2023-05-13 MED ORDER — HYDROXYZINE PAMOATE 25 MG PO CAPS: 25.0000 mg | ORAL_CAPSULE | Freq: Three times a day (TID) | ORAL | Status: DC | PRN

## 2023-05-13 MED ORDER — SERTRALINE HCL 50 MG PO TABS
50.0000 mg | ORAL_TABLET | Freq: Every day | ORAL | Status: DC
  Administered 2023-05-13 – 2023-05-15 (×3): 50 mg via ORAL
  Filled 2023-05-13 (×3): qty 1

## 2023-05-13 MED ORDER — ALVIMOPAN 12 MG PO CAPS
12.0000 mg | ORAL_CAPSULE | Freq: Two times a day (BID) | ORAL | Status: AC
  Administered 2023-05-14: 12 mg via ORAL
  Filled 2023-05-13 (×2): qty 1

## 2023-05-13 MED ORDER — ROCURONIUM BROMIDE 10 MG/ML (PF) SYRINGE
PREFILLED_SYRINGE | INTRAVENOUS | Status: AC
  Filled 2023-05-13: qty 10

## 2023-05-13 MED ORDER — OXYCODONE HCL 5 MG PO TABS
5.0000 mg | ORAL_TABLET | Freq: Four times a day (QID) | ORAL | Status: DC | PRN
  Administered 2023-05-13 – 2023-05-15 (×8): 5 mg via ORAL
  Filled 2023-05-13 (×8): qty 1

## 2023-05-13 MED ORDER — LACTATED RINGERS IR SOLN
Status: DC | PRN
Start: 2023-05-13 — End: 2023-05-13
  Administered 2023-05-13: 1000 mL

## 2023-05-13 MED ORDER — OXYCODONE HCL 5 MG PO TABS: 5.0000 mg | ORAL_TABLET | Freq: Once | ORAL | Status: AC | PRN

## 2023-05-13 MED ORDER — DEXAMETHASONE SODIUM PHOSPHATE 10 MG/ML IJ SOLN
INTRAMUSCULAR | Status: AC
  Filled 2023-05-13: qty 1

## 2023-05-13 MED ORDER — METOCLOPRAMIDE HCL 5 MG/ML IJ SOLN: 10.0000 mg | Freq: Four times a day (QID) | INTRAMUSCULAR | Status: DC | PRN

## 2023-05-13 MED ORDER — DIPHENHYDRAMINE HCL 25 MG PO CAPS: 25.0000 mg | ORAL_CAPSULE | Freq: Four times a day (QID) | ORAL | Status: DC | PRN

## 2023-05-13 MED ORDER — ONDANSETRON HCL 4 MG/2ML IJ SOLN: 4.0000 mg | Freq: Four times a day (QID) | INTRAMUSCULAR | Status: DC | PRN

## 2023-05-13 MED ORDER — CHLORHEXIDINE GLUCONATE 4 % EX SOLN: 60.0000 mL | Freq: Once | CUTANEOUS | Status: DC

## 2023-05-13 MED ORDER — LIDOCAINE HCL (PF) 2 % IJ SOLN
INTRAMUSCULAR | Status: AC
  Filled 2023-05-13: qty 5

## 2023-05-13 MED ORDER — GABAPENTIN 100 MG PO CAPS
300.0000 mg | ORAL_CAPSULE | Freq: Two times a day (BID) | ORAL | Status: DC
  Administered 2023-05-13 – 2023-05-15 (×5): 300 mg via ORAL
  Filled 2023-05-13 (×5): qty 3

## 2023-05-13 MED ORDER — ALBUTEROL SULFATE HFA 108 (90 BASE) MCG/ACT IN AERS: 2.0000 | INHALATION_SPRAY | Freq: Four times a day (QID) | RESPIRATORY_TRACT | Status: DC | PRN

## 2023-05-13 MED ORDER — SIMETHICONE 80 MG PO CHEW
40.0000 mg | CHEWABLE_TABLET | Freq: Four times a day (QID) | ORAL | Status: DC | PRN
  Administered 2023-05-13: 40 mg via ORAL
  Filled 2023-05-13 (×2): qty 1

## 2023-05-13 MED ORDER — BUPIVACAINE LIPOSOME 1.3 % IJ SUSP
INTRAMUSCULAR | Status: AC
  Filled 2023-05-13: qty 20

## 2023-05-13 MED ORDER — ONDANSETRON HCL 4 MG PO TABS: 4.0000 mg | ORAL_TABLET | Freq: Four times a day (QID) | ORAL | Status: DC | PRN

## 2023-05-13 MED ORDER — ONDANSETRON HCL 4 MG/2ML IJ SOLN
INTRAMUSCULAR | Status: DC | PRN
  Administered 2023-05-13: 4 mg via INTRAVENOUS

## 2023-05-13 MED ORDER — METOPROLOL TARTRATE 5 MG/5ML IV SOLN: 5.0000 mg | Freq: Four times a day (QID) | INTRAVENOUS | Status: DC | PRN

## 2023-05-13 MED ORDER — ENSURE PRE-SURGERY PO LIQD
592.0000 mL | Freq: Once | ORAL | Status: DC
  Filled 2023-05-13: qty 592

## 2023-05-13 MED ORDER — ORAL CARE MOUTH RINSE: 15.0000 mL | Freq: Once | OROMUCOSAL | Status: AC

## 2023-05-13 MED ORDER — ENSURE SURGERY PO LIQD
237.0000 mL | Freq: Two times a day (BID) | ORAL | Status: DC
  Administered 2023-05-13 – 2023-05-14 (×2): 237 mL via ORAL

## 2023-05-13 MED ORDER — FENTANYL CITRATE (PF) 100 MCG/2ML IJ SOLN
INTRAMUSCULAR | Status: AC
  Filled 2023-05-13: qty 2

## 2023-05-13 MED ORDER — FENTANYL CITRATE PF 50 MCG/ML IJ SOSY
PREFILLED_SYRINGE | INTRAMUSCULAR | Status: AC
  Administered 2023-05-13: 50 ug via INTRAVENOUS
  Filled 2023-05-13: qty 3

## 2023-05-13 MED ORDER — PROPOFOL 10 MG/ML IV BOLUS
INTRAVENOUS | Status: DC | PRN
  Administered 2023-05-13: 200 mg via INTRAVENOUS

## 2023-05-13 MED ORDER — ACETAMINOPHEN 500 MG PO TABS
1000.0000 mg | ORAL_TABLET | ORAL | Status: AC
  Administered 2023-05-13: 1000 mg via ORAL
  Filled 2023-05-13: qty 2

## 2023-05-13 MED ORDER — FENTANYL CITRATE PF 50 MCG/ML IJ SOSY
25.0000 ug | PREFILLED_SYRINGE | INTRAMUSCULAR | Status: DC | PRN
  Administered 2023-05-13 (×2): 50 ug via INTRAVENOUS

## 2023-05-13 MED ORDER — LIDOCAINE HCL (PF) 2 % IJ SOLN
INTRAMUSCULAR | Status: DC | PRN
Start: 2023-05-13 — End: 2023-05-13
  Administered 2023-05-13: 1.5 mg/kg/h via INTRADERMAL

## 2023-05-13 MED ORDER — ONDANSETRON HCL 4 MG/2ML IJ SOLN: 4.0000 mg | Freq: Once | INTRAMUSCULAR | Status: DC | PRN

## 2023-05-13 MED ORDER — ALBUTEROL SULFATE (2.5 MG/3ML) 0.083% IN NEBU: 3.0000 mL | INHALATION_SOLUTION | Freq: Four times a day (QID) | RESPIRATORY_TRACT | Status: DC | PRN

## 2023-05-13 MED ORDER — OXYCODONE HCL 5 MG/5ML PO SOLN
5.0000 mg | Freq: Once | ORAL | Status: AC | PRN
  Administered 2023-05-13: 5 mg via ORAL

## 2023-05-13 MED ORDER — LIDOCAINE HCL (CARDIAC) PF 100 MG/5ML IV SOSY
PREFILLED_SYRINGE | INTRAVENOUS | Status: DC | PRN
  Administered 2023-05-13: 100 mg via INTRAVENOUS

## 2023-05-13 MED ORDER — OXYCODONE HCL 5 MG PO TABS
5.0000 mg | ORAL_TABLET | Freq: Three times a day (TID) | ORAL | 0 refills | Status: AC | PRN
Start: 2023-05-13 — End: 2023-05-18

## 2023-05-13 MED ORDER — ACETAMINOPHEN 10 MG/ML IV SOLN: 1000.0000 mg | Freq: Once | INTRAVENOUS | Status: DC | PRN

## 2023-05-13 MED ORDER — HYDROMORPHONE HCL 1 MG/ML IJ SOLN: 0.5000 mg | INTRAMUSCULAR | Status: DC | PRN

## 2023-05-13 MED ORDER — METHOCARBAMOL 1000 MG/10ML IJ SOLN
500.0000 mg | Freq: Four times a day (QID) | INTRAVENOUS | Status: DC | PRN
  Administered 2023-05-13 – 2023-05-14 (×2): 500 mg via INTRAVENOUS
  Filled 2023-05-13 (×2): qty 500

## 2023-05-13 MED ORDER — OXYCODONE HCL 5 MG/5ML PO SOLN
ORAL | Status: AC
  Filled 2023-05-13: qty 5

## 2023-05-13 MED ORDER — ACETAMINOPHEN 500 MG PO TABS
1000.0000 mg | ORAL_TABLET | Freq: Four times a day (QID) | ORAL | Status: DC
  Administered 2023-05-13 – 2023-05-15 (×8): 1000 mg via ORAL
  Filled 2023-05-13 (×8): qty 2

## 2023-05-13 MED ORDER — METHOCARBAMOL 750 MG PO TABS: 750.0000 mg | ORAL_TABLET | Freq: Four times a day (QID) | ORAL | 0 refills | Status: DC | PRN

## 2023-05-13 SURGICAL SUPPLY — 71 items
ADH SKN CLS APL DERMABOND .7 (GAUZE/BANDAGES/DRESSINGS)
APPLIER CLIP 5 13 M/L LIGAMAX5 (MISCELLANEOUS)
APPLIER CLIP ROT 10 11.4 M/L (STAPLE)
APR CLP MED LRG 11.4X10 (STAPLE)
APR CLP MED LRG 5 ANG JAW (MISCELLANEOUS)
BAG COUNTER SPONGE SURGICOUNT (BAG) IMPLANT
BAG SPNG CNTER NS LX DISP (BAG)
BLADE EXTENDED COATED 6.5IN (ELECTRODE) IMPLANT
CABLE HIGH FREQUENCY MONO STRZ (ELECTRODE) ×1 IMPLANT
CLIP APPLIE 5 13 M/L LIGAMAX5 (MISCELLANEOUS) IMPLANT
CLIP APPLIE ROT 10 11.4 M/L (STAPLE) IMPLANT
COVER SURGICAL LIGHT HANDLE (MISCELLANEOUS) ×2 IMPLANT
DERMABOND ADVANCED .7 DNX12 (GAUZE/BANDAGES/DRESSINGS) IMPLANT
DRAIN CHANNEL 19F RND (DRAIN) IMPLANT
DRSG OPSITE POSTOP 4X10 (GAUZE/BANDAGES/DRESSINGS) IMPLANT
DRSG OPSITE POSTOP 4X6 (GAUZE/BANDAGES/DRESSINGS) IMPLANT
DRSG OPSITE POSTOP 4X8 (GAUZE/BANDAGES/DRESSINGS) IMPLANT
DRSG TEGADERM 4X4.75 (GAUZE/BANDAGES/DRESSINGS) IMPLANT
ELECT REM PT RETURN 15FT ADLT (MISCELLANEOUS) ×1 IMPLANT
ENDOLOOP SUT PDS II 0 18 (SUTURE) IMPLANT
GAUZE SPONGE 4X4 12PLY STRL (GAUZE/BANDAGES/DRESSINGS) IMPLANT
GLOVE BIO SURGEON STRL SZ 6 (GLOVE) ×2 IMPLANT
GLOVE INDICATOR 6.5 STRL GRN (GLOVE) ×2 IMPLANT
GLOVE SS BIOGEL STRL SZ 6 (GLOVE) ×1 IMPLANT
GOWN STRL REUS W/ TWL LRG LVL3 (GOWN DISPOSABLE) ×2 IMPLANT
GOWN STRL REUS W/ TWL XL LVL3 (GOWN DISPOSABLE) IMPLANT
GOWN STRL REUS W/TWL LRG LVL3 (GOWN DISPOSABLE) ×2
GOWN STRL REUS W/TWL XL LVL3 (GOWN DISPOSABLE)
IRRIG SUCT STRYKERFLOW 2 WTIP (MISCELLANEOUS) ×1
IRRIGATION SUCT STRKRFLW 2 WTP (MISCELLANEOUS) ×1 IMPLANT
KIT TURNOVER KIT A (KITS) IMPLANT
NDL INSUFFLATION 14GA 120MM (NEEDLE) ×1 IMPLANT
NEEDLE INSUFFLATION 14GA 120MM (NEEDLE) ×1
PACK COLON (CUSTOM PROCEDURE TRAY) ×1 IMPLANT
PAD POSITIONING PINK XL (MISCELLANEOUS) IMPLANT
PENCIL SMOKE EVACUATOR (MISCELLANEOUS) IMPLANT
RELOAD STAPLE 60 2.6 WHT THN (STAPLE) IMPLANT
RETRACTOR WND ALEXIS 18 MED (MISCELLANEOUS) IMPLANT
RTRCTR WOUND ALEXIS 18CM MED (MISCELLANEOUS)
SCISSORS LAP 5X35 DISP (ENDOMECHANICALS) ×1 IMPLANT
SET TUBE SMOKE EVAC HIGH FLOW (TUBING) ×1 IMPLANT
SHEARS HARMONIC 36 ACE (MISCELLANEOUS) ×1 IMPLANT
SLEEVE Z-THREAD 5X100MM (TROCAR) ×2 IMPLANT
SPIKE FLUID TRANSFER (MISCELLANEOUS) ×1 IMPLANT
STAPLER ECHELON LONG 60 440 (INSTRUMENTS) IMPLANT
STAPLER ECHELON POWER CIR 29 (STAPLE) IMPLANT
STAPLER RELOAD WHITE 60MM (STAPLE)
STAPLER VISISTAT 35W (STAPLE) IMPLANT
SUT NOVA NAB DX-16 0-1 5-0 T12 (SUTURE) IMPLANT
SUT PDS AB 0 CT1 36 (SUTURE) IMPLANT
SUT PROLENE 2 0 KS (SUTURE) IMPLANT
SUT PROLENE 2 0 SH DA (SUTURE) IMPLANT
SUT SILK 2 0 (SUTURE) ×1
SUT SILK 2 0 SH CR/8 (SUTURE) ×1 IMPLANT
SUT SILK 2-0 18XBRD TIE 12 (SUTURE) ×1 IMPLANT
SUT SILK 3 0 (SUTURE) ×1
SUT SILK 3 0 SH CR/8 (SUTURE) ×1 IMPLANT
SUT SILK 3-0 18XBRD TIE 12 (SUTURE) ×1 IMPLANT
SUT VIC AB 2-0 SH 27 (SUTURE)
SUT VIC AB 2-0 SH 27X BRD (SUTURE) IMPLANT
SUT VIC AB 3-0 SH 27 (SUTURE)
SUT VIC AB 3-0 SH 27XBRD (SUTURE) IMPLANT
SYS LAPSCP GELPORT 120MM (MISCELLANEOUS)
SYS WOUND ALEXIS 18CM MED (MISCELLANEOUS)
SYSTEM LAPSCP GELPORT 120MM (MISCELLANEOUS) IMPLANT
SYSTEM WOUND ALEXIS 18CM MED (MISCELLANEOUS) IMPLANT
TOWEL OR NON WOVEN STRL DISP B (DISPOSABLE) ×1 IMPLANT
TRAY FOLEY MTR SLVR 16FR STAT (SET/KITS/TRAYS/PACK) IMPLANT
TROCAR ADV FIXATION 12X100MM (TROCAR) IMPLANT
TROCAR Z-THREAD OPTICAL 5X100M (TROCAR) ×1 IMPLANT
TUBING CONNECTING 10 (TUBING) ×2 IMPLANT

## 2023-05-13 NOTE — Plan of Care (Signed)

## 2023-05-13 NOTE — H&P (Signed)
George Rodriguez [id number]  DATE OF ENCOUNTER: 03/02/2023 Interval History:   Returns for scheduled follow-up.  He has completed his colonoscopy as below with Dr. Marina Goodell, no lesions were present and the prep was good.  He has gotten married, they did end up going to the [place] and had a great time.  He is ready to discuss colostomy reversal.  Last visit: Returns for scheduled follow-up.  Has continued to do well.  He is back to work, staying active, his appetite has returned and he has actually gained about 12 pounds since our last visit.  Ostomy is functioning well.  He did lift some heavy things recently and had some pain in adjacent to his ostomy, his fiance noted a bulge there which has retracted.  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 He has done well. Some issues with his ostomy leaking and peristomal skin breakdown, followed up with the ostomy clinic and made some changes which have worked really well.  He is going to be getting a Stealth belt soon.  He is eager to get back to work.  Their wedding in June in the [place] is coming up, but with issues currently going on in [place] they are a little worried about this.  He presented on 2/19 with a 2-day history of left lower quadrant abdominal pain and some reported hematuria and dysuria.  Is associated with some anorexia but no emesis.  No prior similar episodes.  Previous abdominal surgery included laparoscopic appendectomy at [facility] complicated by evisceration (?) requiring repair on postop day 1.  He was found to have diverticulitis with microperforation and mesenteric abscess which was not amenable to percutaneous drainage.  Admitted to the hospital and started on IV antibiotic therapy and bowel rest.  His symptoms did initially improve in the first 24 hours however on day 3 of his hospital stay he had progressively worsening  pain, distention, nausea fevers and leukocytosis.  A follow-up CT demonstrated worsening progression of inflammatory changes in extraluminal air.  Subsequently he underwent exploratory laparotomy with sigmoid colectomy and end colostomy on 2/23 which he tolerated well.  He had a mild ileus postoperatively but this resolved within a couple days.  Ultimately he was discharged tolerating regular diet having good ostomy function and stable abdominal wound.  Physical Examination:   There were no vitals filed for this visit.   Alert, well-appearing Unlabored respirations Abdomen is soft, nontender.  Not visible through appliance today.  Midline wound is healed, completely epithelialized at this point.  No palpable incisional hernia.     Assessment and Plan:   Colostomy care (CMS/HHS-HCC)  (primary encounter diagnosis) Plan: polyethylene glycol (MIRALAX) powder, bisacodyL       (DULCOLAX) 5 mg EC tablet, metroNIDAZOLE        (FLAGYL) 500 MG tablet, neomycin 500 mg tablet  Status post Hartmann procedure (CMS/HHS-HCC)   He has recovered well and has completed colonoscopy which fortunately was negative.  Will plan to proceed with laparoscopic colostomy reversal, possible open.  We discussed the procedure and risks of bleeding, infection, pain, scarring, injury to intra-abdominal or retroperitoneal structures, anastomotic leak/bleed/stricture, need for return to the OR and diverting loop ileostomy, ileus, obstruction, wound healing problems/incisional hernia or recurrence of parastomal hernia.  Also went over cardiovascular/pulmonary/thromboembolic complications.  Discussed typical postop recovery timeline.  Questions were welcomed and answered to his satisfaction.  We discussed the prep which she will do the  day before surgery and he was given an informational handout along with the prescriptions.  Will proceed with scheduling.  10/01/22: Procedure performed: exploratory laparotomy, sigmoid partial  colectomy with end colostomy   Preop diagnosis: perforated diverticulitis Post-op diagnosis/intraop findings: Same, perforation into the mesentery with abscess; extensive inflammatory change along the sigmoid mesentery, developing ileus  [name] Carlye Grippe, MD

## 2023-05-13 NOTE — Transfer of Care (Signed)
Immediate Anesthesia Transfer of Care Note  Patient: George Rodriguez  Procedure(s) Performed: LAPAROSCOPIC COLOSTOMY REVERSAL; FLEXIBLE SIGMOIDOSCOPY  Patient Location: PACU  Anesthesia Type:General  Level of Consciousness: awake, alert , and oriented  Airway & Oxygen Therapy: Patient Spontanous Breathing and Patient connected to nasal cannula oxygen  Post-op Assessment: Report given to RN and Post -op Vital signs reviewed and stable  Post vital signs: Reviewed and stable  Last Vitals:  Vitals Value Taken Time  BP 142/85 05/13/23 0930  Temp    Pulse 96 05/13/23 0932  Resp 24 05/13/23 0932  SpO2 98 % 05/13/23 0932  Vitals shown include unfiled device data.  Last Pain:  Vitals:   05/13/23 0609  TempSrc:   PainSc: 0-No pain      Patients Stated Pain Goal: 5 (05/13/23 1610)  Complications: No notable events documented.

## 2023-05-13 NOTE — Anesthesia Postprocedure Evaluation (Signed)
Anesthesia Post Note  Patient: George Rodriguez  Procedure(s) Performed: LAPAROSCOPIC COLOSTOMY REVERSAL; FLEXIBLE SIGMOIDOSCOPY     Patient location during evaluation: PACU Anesthesia Type: General Level of consciousness: awake and alert Pain management: pain level controlled Vital Signs Assessment: post-procedure vital signs reviewed and stable Respiratory status: spontaneous breathing, nonlabored ventilation, respiratory function stable and patient connected to nasal cannula oxygen Cardiovascular status: blood pressure returned to baseline and stable Postop Assessment: no apparent nausea or vomiting Anesthetic complications: no   No notable events documented.  Last Vitals:  Vitals:   05/13/23 0930 05/13/23 0945  BP: (!) 161/90 (!) 142/85  Pulse: 95 91  Resp: 14 17  Temp: (!) 36.4 C   SpO2: 96% 98%    Last Pain:  Vitals:   05/13/23 0930  TempSrc:   PainSc: 10-Worst pain ever                 Mariann Barter

## 2023-05-13 NOTE — Anesthesia Preprocedure Evaluation (Signed)
Anesthesia Evaluation  Patient identified by MRN, date of birth, ID band Patient awake    Reviewed: Allergy & Precautions, NPO status , Patient's Chart, lab work & pertinent test results, reviewed documented beta blocker date and time   History of Anesthesia Complications Negative for: history of anesthetic complications  Airway Mallampati: III  TM Distance: >3 FB Neck ROM: Full    Dental no notable dental hx.    Pulmonary neg shortness of breath, asthma , neg sleep apnea, neg COPD, neg recent URI, neg PE   breath sounds clear to auscultation       Cardiovascular hypertension, (-) angina (-) CAD, (-) Past MI, (-) Cardiac Stents and (-) CABG  Rhythm:Regular Rate:Normal     Neuro/Psych neg Seizures  Anxiety        GI/Hepatic ,neg GERD  ,,(+) neg Cirrhosis      Diverticulitis s/p sigmoid colectomy   Endo/Other  neg diabetes    Renal/GU Renal disease     Musculoskeletal   Abdominal   Peds  Hematology   Anesthesia Other Findings   Reproductive/Obstetrics                              Anesthesia Physical Anesthesia Plan  ASA: 2  Anesthesia Plan: General   Post-op Pain Management:    Induction: Intravenous  PONV Risk Score and Plan: 2 and Ondansetron and Dexamethasone  Airway Management Planned: Oral ETT  Additional Equipment:   Intra-op Plan:   Post-operative Plan: Extubation in OR  Informed Consent: I have reviewed the patients History and Physical, chart, labs and discussed the procedure including the risks, benefits and alternatives for the proposed anesthesia with the patient or authorized representative who has indicated his/her understanding and acceptance.     Dental advisory given  Plan Discussed with: CRNA  Anesthesia Plan Comments:          Anesthesia Quick Evaluation

## 2023-05-13 NOTE — Anesthesia Procedure Notes (Signed)
Procedure Name: Intubation Date/Time: 05/13/2023 7:38 AM  Performed by: Nathen May, CRNAPre-anesthesia Checklist: Patient identified, Emergency Drugs available, Suction available and Patient being monitored Patient Re-evaluated:Patient Re-evaluated prior to induction Oxygen Delivery Method: Circle System Utilized Preoxygenation: Pre-oxygenation with 100% oxygen Induction Type: IV induction Ventilation: Mask ventilation without difficulty Laryngoscope Size: Mac and 3 Grade View: Grade I Tube type: Oral Number of attempts: 1 Airway Equipment and Method: Stylet and Oral airway Placement Confirmation: ETT inserted through vocal cords under direct vision, positive ETCO2 and breath sounds checked- equal and bilateral Tube secured with: Tape Dental Injury: Teeth and Oropharynx as per pre-operative assessment

## 2023-05-13 NOTE — Discharge Instructions (Signed)
POST OP INSTRUCTIONS AFTER COLON SURGERY  DIET: Be sure to include lots of fluids daily to stay hydrated - 64oz of water per day (8, 8 oz glasses).  Avoid fast food or heavy meals for the first couple of weeks as your are more likely to get nauseated. Avoid raw/uncooked fruits or vegetables for the first 4 weeks (its ok to have these if they are blended into smoothie form). If you have fruits/vegetables, make sure they are cooked until soft enough to mash on the roof of your mouth and chew your food well. Otherwise, diet as tolerated.  Take your usually prescribed home medications unless otherwise directed.  PAIN CONTROL: Pain is best controlled by a usual combination of three different methods TOGETHER: Ice/Heat Over the counter pain medication Prescription pain medication Most patients will experience some swelling and bruising around the surgical site.  Ice packs or heating pads (30-60 minutes up to 6 times a day) will help. Some people prefer to use ice alone, heat alone, alternating between ice & heat.  Experiment to what works for you.  Swelling and bruising can take several weeks to resolve.   It is helpful to take an over-the-counter pain medication regularly for the first few weeks: Ibuprofen (Motrin/Advil) - 200mg  tabs - take 3 tabs (600mg ) every 6 hours as needed for pain (unless you have been directed previously to avoid NSAIDs/ibuprofen) Acetaminophen (Tylenol) - you may take 650mg  every 6 hours as needed. You can take this with motrin as they act differently on the body. If you are taking a narcotic pain medication that has acetaminophen in it, do not take over the counter tylenol at the same time. NOTE: You may take both of these medications together - most patients  find it most helpful when alternating between the two (i.e. Ibuprofen at 6am, tylenol at 9am, ibuprofen at 12pm ..Marland Kitchen) A  prescription for pain medication should be given to you upon discharge.  Take your pain medication as  prescribed if your pain is not adequatly controlled with the over-the-counter pain reliefs mentioned above.  Avoid getting constipated.  Between the surgery and the pain medications, it is common to experience some constipation.  Increasing fluid intake and taking a fiber supplement (such as Metamucil, Citrucel, FiberCon, MiraLax, etc) 1-2 times a day regularly will usually help prevent this problem from occurring.  A mild laxative (prune juice, Milk of Magnesia, MiraLax, etc) should be taken according to package directions if there are no bowel movements after 48 hours.    Dressing: Your incisions are covered in Dermabond which is like sterile superglue for the skin. This will come off on it's own in a couple weeks. It is waterproof and you may bathe normally starting the day after your surgery in a shower. Avoid baths/pools/lakes/oceans until your wounds have fully healed. Your colostomy site had packing that should have been removed before you left the hospital. Keep this site covered with dry gauze and tape, change daily and after showers.   ACTIVITIES as tolerated:   Avoid heavy lifting (>10lbs or 1 gallon of milk) for the next 6 weeks. You may resume regular daily activities as tolerated--such as daily self-care, walking, climbing stairs--gradually increasing activities as tolerated.  If you can walk 30 minutes without difficulty, it is safe to try more intense activity such as jogging, treadmill, bicycling, low-impact aerobics.  DO NOT PUSH THROUGH PAIN.  Let pain be your guide: If it hurts to do something, don't do it. You may drive  when you are no longer taking prescription pain medication, you can comfortably wear a seatbelt, and you can safely maneuver your car and apply brakes.  FOLLOW UP in our office Please call [facility] at [phone] to set up an appointment to see your surgeon in the office for a follow-up appointment approximately 2 weeks after your surgery. Make sure that you call for  this appointment the day you arrive home to insure a convenient appointment time.  9. If you have disability or family leave forms that need to be completed, you may have them completed by your primary care physician's office; for return to work instructions, please ask our office staff and they will be happy to assist you in obtaining this documentation   When to call us [phone]: Poor pain control Reactions / problems with new medications (rash/itching, etc)  Fever over 101.5 F (38.5 C) Inability to urinate Nausea/vomiting Worsening swelling or bruising Continued bleeding from incision. Increased pain, redness, or drainage from the incision  The clinic staff is available to answer your questions during regular business hours (8:30am-5pm).  Please don't hesitate to call and ask to speak to one of our nurses for clinical concerns.   A surgeon from [facility] is always on call at the hospitals   If you have a medical emergency, go to the nearest emergency room or call 911.  [facility] [address], [address], [place], [place]  [address] MAIN: [phone] FAX: [phone] [email or web address]

## 2023-05-13 NOTE — Op Note (Signed)
Operative Note  George Rodriguez  [id number]  [id number]  05/13/2023   Surgeon: Phylliss Blakes MD FACS   Assistant: Romie Levee MD FACS   Procedure performed: Laparoscopic reversal of end colostomy   Preop diagnosis: End colostomy Post-op diagnosis/intraop findings: same   Specimens: Colostomy Retained items: no  EBL: minimal  Complications: none   Description of procedure: After confirming informed consent the patient was taken to the operating room and placed supine on operating room table where general endotracheal anesthesia was initiated, preoperative antibiotics were administered, SCDs applied, and a formal timeout was performed.  The patient was repositioned to dorsal lithotomy with all pressure points appropriately padded.  Foley catheter was placed sterilely.  The stoma was occluded with a pursestring 2-0 silk suture and the abdominal wall was clipped, prepped and draped in usual sterile fashion.  Peritoneal access was gained with a left subcostal Veress needle and insufflated to without incident.  A 5 mm right upper quadrant trocar was placed using optical entry and the abdominal cavity was surveyed.  There was no injury from our entry.  Bilateral laparoscopic assisted tap blocks were performed with Exparel mostly quarter percent Marcaine epinephrine for postoperative pain control and then under direct visualization 2 additional right sided 5 mm trocars were placed.  Sparse omental adhesions to the anterior midline into the colostomy were taken down with monic scalpel and blunt dissection.  There was a long band adhesion between a loop of small bowel and the colostomy which was divided sharply well away from the small bowel serosa.  Once the stoma had been dissected internally and the anterior abdominal wall was freed, the patient was placed in steep Trendelenburg and the pelvis inspected.  Minimal filmy adhesions between the rectum and the anterior pelvis were divided with the  scissors.  Rectal stump appeared viable and well-perfused and was actually fairly mobile.  At this point the stoma was addressed externally, using cautery to divide a small skin margin from the surrounding skin surrounding the stoma and a combination of blunt dissection, cautery and sharp dissection to free the colostomy from the parastomal hernia sac and surrounding soft tissues until it was completely liberated and could be mobilized through the stoma site.  The site was selected for our anastomosis a few centimeters proximal to the stoma and the pursestring device was placed here allowing [place] to place a 2-0 Prolene pursestring suture; the stoma excised and handed off for pathology.  The pursestring device was removed and the pursestring suture secured with 3 simple interrupted 3-0 silk sutures.  A 29 EEA anvil was introduced into the descending colon and the pursestring suture was tied down.  The descending colon was then dunked back into the abdominal cavity and the Alexis wound protector and Were placed and the abdomen was reinsufflated.  The colon was noted to reach easily into the pelvis and did not require further mobilization. Dr. Maisie Fus then went below and cleared the rectal stump of inspissated mucus, was able to easily introduce a 29 EEA sizer followed by the stapler and the spike was brought out along the anterior rectal wall about 3 cm proximal to the staple line.  The anvil was connected to the spike, ensuring that the descending colon remained straight without any twisting and doing so, and the stapler was closed and fired uneventfully.  The proximal descending colon was gently occluded with a grasper and the pelvis instilled with irrigation fluid.  Dr. Maisie Fus then performed a flexible proctoscopy  and was able to navigate up beyond the anastomosis which appeared to be well intact, hemostatic and well-perfused.  There was no bubbles in the irrigation fluid confirming a negative leak test.  The colon  was desufflated and the irrigation fluid removed from the pelvis.  The abdomen was desufflated and all trocars were removed.  The stoma site was closed vertically with interrupted figure-of-eight #1 Novofils.  The skin was cinched down with a 2-0 Vicryl leaving an open wound which was packed with a saline dampened 4 x 4 followed by dry gauze and Tegaderm dressing.  The port sites were closed with subcuticular 4 Monocryl and Dermabond.  The patient was then awakened, extubated and taken to PACU in stable condition.    All counts were correct at the completion of the case.

## 2023-05-14 ENCOUNTER — Encounter (HOSPITAL_COMMUNITY): Payer: Self-pay | Admitting: Surgery

## 2023-05-14 LAB — CBC
HCT: 46.7 % (ref 39.0–52.0)
Hemoglobin: 15.7 g/dL (ref 13.0–17.0)
MCH: 31.4 pg (ref 26.0–34.0)
MCHC: 33.6 g/dL (ref 30.0–36.0)
MCV: 93.4 fL (ref 80.0–100.0)
Platelets: 323 10*3/uL (ref 150–400)
RBC: 5 MIL/uL (ref 4.22–5.81)
RDW: 12.3 % (ref 11.5–15.5)
WBC: 12.1 10*3/uL — ABNORMAL HIGH (ref 4.0–10.5)
nRBC: 0 % (ref 0.0–0.2)

## 2023-05-14 LAB — BASIC METABOLIC PANEL
Anion gap: 11 (ref 5–15)
BUN: 10 mg/dL (ref 6–20)
CO2: 22 mmol/L (ref 22–32)
Calcium: 8.8 mg/dL — ABNORMAL LOW (ref 8.9–10.3)
Chloride: 101 mmol/L (ref 98–111)
Creatinine, Ser: 0.93 mg/dL (ref 0.61–1.24)
GFR, Estimated: >60 mL/min (ref >60)
Glucose, Bld: 124 mg/dL — ABNORMAL HIGH (ref 70–99)
Potassium: 3.8 mmol/L (ref 3.5–5.1)
Sodium: 134 mmol/L — ABNORMAL LOW (ref 135–145)

## 2023-05-14 LAB — MAGNESIUM: Magnesium: 1.9 mg/dL (ref 1.7–2.4)

## 2023-05-14 NOTE — Plan of Care (Signed)
  Problem: Education: Goal: Knowledge of General Education information will improve Description Including pain rating scale, medication(s)/side effects and non-pharmacologic comfort measures Outcome: Progressing   Problem: Health Behavior/Discharge Planning: Goal: Ability to manage health-related needs will improve Outcome: Progressing   

## 2023-05-14 NOTE — Plan of Care (Signed)

## 2023-05-14 NOTE — Progress Notes (Signed)
1 Day Post-Op   Subjective/Chief Complaint: Had bm with some blood, having a lot of flatus, tol liquids, ambulating urinating   Objective: Vital signs in last 24 hours: Temp:  [97.5 F (36.4 C)-98.3 F (36.8 C)] 98.1 F (36.7 C) (10/05 0607) Pulse Rate:  [69-107] 87 (10/05 0607) Resp:  [10-20] 20 (10/05 0607) BP: (122-167)/(75-101) 165/92 (10/05 0607) SpO2:  [95 %-100 %] 100 % (10/05 0607) Last BM Date : 05/12/23  Intake/Output from previous day: 10/04 0701 - 10/05 0700 In: 2450.4 [P.O.:1060; I.V.:1280.4; IV Piggyback:110] Out: 2790 [Urine:2780; Blood:10] Intake/Output this shift: No intake/output data recorded.  General nad Cv regular  Pulm effort normal Ab sof tapprop tender dressing with drainage  Lab Results:  Recent Labs    05/14/23 0522  WBC 12.1*  HGB 15.7  HCT 46.7  PLT 323   BMET Recent Labs    05/14/23 0655  NA 134*  K 3.8  CL 101  CO2 22  GLUCOSE 124*  BUN 10  CREATININE 0.93  CALCIUM 8.8*   PT/INR No results for input(s): "LABPROT", "INR" in the last 72 hours. ABG No results for input(s): "PHART", "HCO3" in the last 72 hours.  Invalid input(s): "PCO2", "PO2"  Studies/Results: No results found.  Anti-infectives: Anti-infectives (From admission, onward)    Start     Dose/Rate Route Frequency Ordered Stop   05/13/23 0600  cefoTEtan (CEFOTAN) 2 g in sodium chloride 0.9 % 100 mL IVPB        2 g 200 mL/hr over 30 Minutes Intravenous On call to O.R. 05/13/23 0533 05/13/23 0752       Assessment/Plan: POD 1 colostomy takedown -doing great -adat -recheck cbc in am -lovenox -possible home tomorrow/monday  George Rodriguez 05/14/2023

## 2023-05-15 LAB — CBC
HCT: 44.8 % (ref 39.0–52.0)
Hemoglobin: 15.2 g/dL (ref 13.0–17.0)
MCH: 31.5 pg (ref 26.0–34.0)
MCHC: 33.9 g/dL (ref 30.0–36.0)
MCV: 92.8 fL (ref 80.0–100.0)
Platelets: 269 10*3/uL (ref 150–400)
RBC: 4.83 MIL/uL (ref 4.22–5.81)
RDW: 12.1 % (ref 11.5–15.5)
WBC: 7.9 10*3/uL (ref 4.0–10.5)
nRBC: 0 % (ref 0.0–0.2)

## 2023-05-15 NOTE — Plan of Care (Signed)

## 2023-05-15 NOTE — Progress Notes (Signed)
Assessment unchanged. Pt and wife verbalized understanding of dc instructions through teach back including medications, follow up care and when to call the surgeon. Discharged via foot per request to front entrance accompanied by wife and NT.

## 2023-05-15 NOTE — Discharge Summary (Signed)
Physician Discharge Summary  Patient ID: George Rodriguez MRN: [id number] DOB/AGE: Mar 23, 1991 32 y.o.  Admit date: 05/13/2023 Discharge date: 05/15/2023  Admission Diagnoses: Colostomy s/p hartmanns  Discharge Diagnoses:  Principal Problem:   History of colostomy reversal   Discharged Condition: good  Hospital Course: 29 yom underwent lap colostomy reversal 10/4.  Doing great. Tol diet, having bowel function, pain controlled. Ready for dc home  Consults: None  Significant Diagnostic Studies: none  Treatments: surgery: lap colostomy reversal  Discharge Exam: Blood pressure (!) 153/91, pulse 74, temperature (!) 97.5 F (36.4 C), temperature source Oral, resp. rate 18, height 5' 11.5" (1.816 m), weight 120.5 kg, SpO2 100%. General nad Cv regular Pulm effort normal Ab soft approp tender incisoins clean ostomy site unpacked without infection  Disposition: Discharge disposition: 01-Home or Self Care        Allergies as of 05/15/2023   No Known Allergies      Medication List     TAKE these medications    acetaminophen 500 MG tablet Commonly known as: TYLENOL Take 2 tablets (1,000 mg total) by mouth every 6 (six) hours as needed.   albuterol 108 (90 Base) MCG/ACT inhaler Commonly known as: VENTOLIN HFA Inhale 2 puffs into the lungs every 6 (six) hours as needed for wheezing or shortness of breath.   clobetasol 0.05 % topical foam Commonly known as: OLUX Apply topically as directed. Daily to bid up to 2 weeks to more severe areas of eczema on body, avoid face, groin, axilla   Clobetasol Propionate Emulsion 0.05 % topical foam Apply BID to affected areas on body up to 2 weeks as needed. Avoid applying to face, groin, and axilla.   Dupixent 300 MG/2ML prefilled syringe Generic drug: dupilumab Inject 300 mg into the skin every 14 (fourteen) days. Starting at day 15 for maintenance.   hydrOXYzine 25 MG capsule Commonly known as: VISTARIL Take 1 capsule (25 mg  total) by mouth every 8 (eight) hours as needed.   lisinopril 10 MG tablet Commonly known as: ZESTRIL Take 1 tablet (10 mg total) by mouth daily.   methocarbamol 750 MG tablet Commonly known as: Robaxin-750 Take 1 tablet (750 mg total) by mouth every 6 (six) hours as needed for muscle spasms (pain).   Opzelura 1.5 % Crea Generic drug: Ruxolitinib Phosphate Apply 1 Application topically as directed. Daily to bid to eczema on face and body as needed for flares   oxyCODONE 5 MG immediate release tablet Commonly known as: Roxicodone Take 1 tablet (5 mg total) by mouth every 8 (eight) hours as needed for up to 5 days. Alternate tylenol and ibuprofen for the first few days. Take narcotic pain medication only if needed for severe/ breakthrough pain.   sertraline 50 MG tablet Commonly known as: ZOLOFT Take 1/2 a pill daily for 1 week, then increase to 1 pill daily        Follow-up Information     Berna Bue, MD Follow up in 3 week(s).   Specialty: General Surgery Contact information: [address] [place] [address] [phone]                 Signed: Emelia Loron 05/15/2023, 8:57 AM

## 2023-05-16 LAB — SURGICAL PATHOLOGY

## 2023-05-18 ENCOUNTER — Telehealth: Payer: Self-pay

## 2023-05-18 NOTE — Transitions of Care (Post Inpatient/ED Visit) (Signed)
   05/18/2023  Name: George Rodriguez MRN: [id number] DOB: 25-Mar-1991  Today's TOC FU Call Status: Today's TOC FU Call Status:: Unsuccessful Call (1st Attempt)  Attempted to reach the patient regarding the most recent Inpatient/ED visit.  Follow Up Plan: Additional outreach attempts will be made to reach the patient to complete the Transitions of Care (Post Inpatient/ED visit) call.     Antionette Fairy, RN,BSN,CCM RN Care Manager Transitions of Care  [place]/Population Health  Direct Phone: [phone] Toll Free: [phone] Fax: [phone]

## 2023-05-18 NOTE — Transitions of Care (Post Inpatient/ED Visit) (Signed)
05/18/2023  Name: George Rodriguez MRN: [id number] DOB: October 19, 1990  Today's TOC FU Call Status: Today's TOC FU Call Status:: Successful TOC FU Call Completed TOC FU Call Complete Date: 05/18/23 (Incoming call from patient returning RN CM call.) Patient's Name and Date of Birth confirmed.  Transition Care Management Follow-up Telephone Call Date of Discharge: 05/15/23 Discharge Facility: [facility]) Type of Discharge: Inpatient Admission Primary Inpatient Discharge Diagnosis:: "s/p lap colostomy reversal" How have you been since you were released from the hospital?: Better (Pt voices he is doing good-current pain 4/10-took Robaxin about an hr ago. He is up walking & moving around. Appetite good-adhering to diet restricitons.-having BMs. Surg incision healing-no s/s of infection.) Any questions or concerns?: Yes Patient Questions/Concerns:: patient wanting to know why his surgeon f/u appt was not showing in his mychart Patient Questions/Concerns Addressed: Other: (Advised pt that surgeon office was not a part of [facility] and with [facility] system and that was the likely cause. He confirms office has called him and he has appt.)  Items Reviewed: Did you receive and understand the discharge instructions provided?: Yes Medications obtained,verified, and reconciled?: Yes (Medications Reviewed) Any new allergies since your discharge?: No Dietary orders reviewed?: Yes Type of Diet Ordered:: low fiber Do you have support at home?: Yes People in Home: spouse Name of Support/Comfort Primary Source: Maralyn Sago  Medications Reviewed Today: Medications Reviewed Today     Reviewed by Charlyn Minerva, RN (Registered Nurse) on 05/18/23 at 1052  Med List Status: <None>   Medication Order Taking? Sig Documenting Provider Last Dose Status Informant  acetaminophen (TYLENOL) 500 MG tablet [id number] Yes Take 2 tablets (1,000 mg total) by mouth every 6 (six) hours as needed. Barnetta Chapel, PA-C  Taking Active Self  albuterol (VENTOLIN HFA) 108 (90 Base) MCG/ACT inhaler [id number] Yes Inhale 2 puffs into the lungs every 6 (six) hours as needed for wheezing or shortness of breath. Viviano Simas, FNP Taking Active Self  clobetasol (OLUX) 0.05 % topical foam [id number] Yes Apply topically as directed. Daily to bid up to 2 weeks to more severe areas of eczema on body, avoid face, groin, axilla Willeen Niece, MD Taking Active Self  Clobetasol Propionate Emulsion 0.05 % topical foam [id number] Yes Apply BID to affected areas on body up to 2 weeks as needed. Avoid applying to face, groin, and axilla. Willeen Niece, MD Taking Active Self  dupilumab (DUPIXENT) 300 MG/2ML prefilled syringe [id number] Yes Inject 300 mg into the skin every 14 (fourteen) days. Starting at day 15 for maintenance. Willeen Niece, MD Taking Active Self  hydrOXYzine (VISTARIL) 25 MG capsule [id number] Yes Take 1 capsule (25 mg total) by mouth every 8 (eight) hours as needed. Olevia Perches P, DO Taking Active   ibuprofen (ADVIL) 200 MG tablet [id number] Yes Take 600 mg by mouth every 6 (six) hours as needed for moderate pain. [provider] Taking Active Self  lisinopril (ZESTRIL) 10 MG tablet [id number] Yes Take 1 tablet (10 mg total) by mouth daily. Olevia Perches P, DO Taking Active   methocarbamol (ROBAXIN-750) 750 MG tablet [id number] Yes Take 1 tablet (750 mg total) by mouth every 6 (six) hours as needed for muscle spasms (pain). Berna Bue, MD Taking Active   oxyCODONE (ROXICODONE) 5 MG immediate release tablet [id number] Yes Take 1 tablet (5 mg total) by mouth every 8 (eight) hours as needed for up to 5 days. Alternate tylenol and ibuprofen for the first few days. Take narcotic pain medication only if needed  for severe/ breakthrough pain. Berna Bue, MD Taking Active   Ruxolitinib Phosphate (OPZELURA) 1.5 % CREA [id number] Yes Apply 1 Application topically as directed. Daily to bid to eczema on face and body as  needed for flares Willeen Niece, MD Taking Active Self  sertraline (ZOLOFT) 50 MG tablet [id number] Yes Take 1/2 a pill daily for 1 week, then increase to 1 pill daily Dorcas Carrow, DO Taking Active             Home Care and Equipment/Supplies: Were Home Health Services Ordered?: NA Any new equipment or medical supplies ordered?: NA  Functional Questionnaire: Do you need assistance with bathing/showering or dressing?: No Do you need assistance with meal preparation?: No Do you need assistance with eating?: No Do you have difficulty maintaining continence: No Do you need assistance with getting out of bed/getting out of a chair/moving?: No Do you have difficulty managing or taking your medications?: No  Follow up appointments reviewed: PCP Follow-up appointment confirmed?: Yes Date of PCP follow-up appointment?: 06/06/23 Follow-up Provider: Dr. Laural Benes Specialist [facility] Follow-up appointment confirmed?: Yes Date of Specialist follow-up appointment?: 06/07/23 Follow-Up Specialty Provider:: Dr. Fredricka Bonine Do you need transportation to your follow-up appointment?: No (pt aware not to drive while taking prescription pain meds and confirms he has transportation to appts) Do you understand care options if your condition(s) worsen?: Yes-patient verbalized understanding  SDOH Interventions Today    Flowsheet Row Most Recent Value  SDOH Interventions   Food Insecurity Interventions Intervention Not Indicated  Transportation Interventions Intervention Not Indicated      TOC Interventions Today    Flowsheet Row Most Recent Value  TOC Interventions   TOC Interventions Discussed/Reviewed TOC Interventions Discussed, Post discharge activity limitations per provider, Post op wound/incision care, S/S of infection      Interventions Today    Flowsheet Row Most Recent Value  General Interventions   General Interventions Discussed/Reviewed General Interventions Discussed, Doctor  Visits, Durable Medical Equipment (DME)  Doctor Visits Discussed/Reviewed Doctor Visits Discussed, Specialist, PCP  Durable Medical Equipment (DME) BP Cuff  [discussed w/ pt importance of monitoring BP in the home at least daily-wife checked pt's BP while on phone-BP 132/86]  PCP/Specialist Visits Compliance with follow-up visit  Education Interventions   Education Provided Provided Education  Provided Verbal Education On Medication, Nutrition, Other  [pain mgmt, bowel mgmt]  Nutrition Interventions   Nutrition Discussed/Reviewed Nutrition Discussed, Fluid intake, Adding fruits and vegetables  Pharmacy Interventions   Pharmacy Dicussed/Reviewed Pharmacy Topics Discussed, Medications and their functions  Safety Interventions   Safety Discussed/Reviewed Safety Discussed          Antionette Fairy, RN,BSN,CCM RN Care Manager Transitions of Care  -[facility]/Population Health  Direct Phone: [phone] Toll Free: [phone] Fax: [phone]

## 2023-05-27 ENCOUNTER — Other Ambulatory Visit: Payer: Self-pay | Admitting: Family Medicine

## 2023-05-27 NOTE — Telephone Encounter (Signed)
Requests too soon for refill.  Requested Prescriptions  Pending Prescriptions Disp Refills   sertraline (ZOLOFT) 50 MG tablet [Pharmacy Med Name: SERTRALINE HCL 50 MG TABLET] 90 tablet 2    Sig: TAKE 1/2 A PILL DAILY FOR 1 WEEK, THEN INCREASE TO 1 PILL DAILY     Psychiatry:  Antidepressants - SSRI - sertraline Failed - 05/27/2023 10:31 AM      Failed - ALT in normal range and within 360 days    ALT  Date Value Ref Range Status  05/05/2023 75 (H) 0 - 44 IU/L Final         Passed - AST in normal range and within 360 days    AST  Date Value Ref Range Status  05/05/2023 38 0 - 40 IU/L Final         Passed - Completed PHQ-2 or PHQ-9 in the last 360 days      Passed - Valid encounter within last 6 months    Recent Outpatient Visits           3 weeks ago Primary hypertension   [facility] [place], [place], DO   1 year ago COVID-19   [place] [facility] Larae Grooms, NP   3 years ago Lower abdominal pain   [facility] [place] Maser [place], [place]   4 years ago Moderate persistent asthma without complication   [place] [facility] [place], [place], DO   4 years ago Influenza A   [facility] [place], [place]       Future Appointments             In 1 week Laural Benes, Oralia Rud, DO [place] [facility]   In 3 months Willeen Niece, MD [place] [facility]             hydrOXYzine (VISTARIL) 25 MG capsule [Pharmacy Med Name: HYDROXYZINE PAM 25 MG CAP] 270 capsule 1    Sig: TAKE 1 CAPSULE (25 MG TOTAL) BY MOUTH EVERY 8 (EIGHT) HOURS AS NEEDED.     Ear, Nose, and Throat:  Antihistamines 2 Passed - 05/27/2023 10:31 AM      Passed - Cr in normal range and within 360 days    Creatinine, Ser  Date Value Ref Range Status  05/14/2023 0.93 0.61 - 1.24 mg/dL Final         Passed - Valid encounter within last 12 months    Recent  Outpatient Visits           3 weeks ago Primary hypertension   [place] [facility] [place], [place], DO   1 year ago COVID-19   [facility] Larae Grooms, NP   3 years ago Lower abdominal pain   [facility] [place] Maser [place], [place]   4 years ago Moderate persistent asthma without complication   [facility] [place], [place], DO   4 years ago Influenza A   [place] [facility] [place], [place], [place]       Future Appointments             In 1 week Laural Benes, Oralia Rud, DO [place] [facility], [facility]   In 3 months Willeen Niece, MD [facility]

## 2023-06-06 ENCOUNTER — Ambulatory Visit: Payer: BC Managed Care – PPO | Admitting: Family Medicine

## 2023-06-17 ENCOUNTER — Ambulatory Visit: Payer: BC Managed Care – PPO | Admitting: Family Medicine

## 2023-06-17 ENCOUNTER — Encounter: Payer: Self-pay | Admitting: Family Medicine

## 2023-06-17 VITALS — BP 148/84 | HR 81 | Ht 71.5 in | Wt 252.6 lb

## 2023-06-17 DIAGNOSIS — J069 Acute upper respiratory infection, unspecified: Secondary | ICD-10-CM | POA: Diagnosis not present

## 2023-06-17 DIAGNOSIS — I10 Essential (primary) hypertension: Secondary | ICD-10-CM | POA: Diagnosis not present

## 2023-06-17 DIAGNOSIS — F17290 Nicotine dependence, other tobacco product, uncomplicated: Secondary | ICD-10-CM | POA: Diagnosis not present

## 2023-06-17 DIAGNOSIS — F419 Anxiety disorder, unspecified: Secondary | ICD-10-CM

## 2023-06-17 MED ORDER — LISINOPRIL 20 MG PO TABS: 20.0000 mg | ORAL_TABLET | Freq: Every day | ORAL | 2 refills | Status: DC

## 2023-06-17 MED ORDER — BENZONATATE 200 MG PO CAPS: 200.0000 mg | ORAL_CAPSULE | Freq: Two times a day (BID) | ORAL | 0 refills | Status: DC | PRN

## 2023-06-17 NOTE — Progress Notes (Signed)
BP (!) 148/84   Pulse 81   Ht 5' 11.5" (1.816 m)   Wt 252 lb 9.6 oz (114.6 kg)   SpO2 98%   BMI 34.74 kg/m    Subjective:    Patient ID: George Rodriguez, male    DOB: 1990-11-14, 32 y.o.   MRN: [id number]  HPI: George Rodriguez is a 32 y.o. male  Chief Complaint  Patient presents with   Hypertension   Anxiety   HYPERTENSION  Hypertension status: better  Satisfied with current treatment? yes Duration of hypertension: chronic BP monitoring frequency:  a few times a week BP range: 126/74- 151/82 BP medication side effects:  no Medication compliance: excellent compliance Previous BP meds:lisinopril Aspirin: no Recurrent headaches: no Visual changes: no Palpitations: no Dyspnea: no Chest pain: no Lower extremity edema: no Dizzy/lightheaded: no  ANXIETY/STRESS- stopped taking his sertraline, felt like he was dreaming while he was taking it.  Duration: chronic Status:better Anxious mood: no  Excessive worrying: no Irritability: no  Sweating: no Nausea: no Palpitations:no Hyperventilation: no Panic attacks: no Agoraphobia: no  Obscessions/compulsions: no Depressed mood: no    06/17/2023    3:35 PM 05/05/2023    2:51 PM  Depression screen PHQ 2/9  Decreased Interest 1 1  Down, Depressed, Hopeless 1 0  PHQ - 2 Score 2 1  Altered sleeping 0 0  Tired, decreased energy 1 1  Change in appetite 0 2  Feeling bad or failure about yourself  0 0  Trouble concentrating 0 0  Moving slowly or fidgety/restless 0 0  Suicidal thoughts 0 0  PHQ-9 Score 3 4  Difficult doing work/chores Somewhat difficult Not difficult at all      06/17/2023    3:36 PM 05/05/2023    2:52 PM  GAD 7 : Generalized Anxiety Score  Nervous, Anxious, on Edge 1 3  Control/stop worrying 1 2  Worry too much - different things 1 2  Trouble relaxing 0 2  Restless 0 0  Easily annoyed or irritable 0 2  Afraid - awful might happen 0 0  Total GAD 7 Score 3 11  Anxiety Difficulty Not difficult at all  Somewhat difficult   Anhedonia: no Weight changes: no Insomnia: no   Hypersomnia: no Fatigue/loss of energy: no Feelings of worthlessness: no Feelings of guilt: no Impaired concentration/indecisiveness: no Suicidal ideations: no  Crying spells: no Recent Stressors/Life Changes: no   Relationship problems: no   Family stress: no     Financial stress: no    Job stress: no    Recent death/loss: no  UPPER RESPIRATORY TRACT INFECTION Duration: 5 days Worst symptom: cough Fever: yes 100.1 about 4 days Cough: yes Shortness of breath: no Wheezing: no Chest pain: no Chest tightness: no Chest congestion: yes Nasal congestion: no Runny nose: no Post nasal drip: no Sneezing: no Sore throat: no Swollen glands: no Sinus pressure: no Headache: no Face pain: no Toothache: no Ear pain: no  Ear pressure: no  Eyes red/itching:no Eye drainage/crusting: no  Vomiting: no Rash: no Fatigue: yes Sick contacts: yes Strep contacts: no  Context: stable Recurrent sinusitis: no Relief with OTC cold/cough medications: no  Treatments attempted: cold/sinus, mucinex, and anti-histamine    Relevant past medical, surgical, family and social history reviewed and updated as indicated. Interim medical history since our last visit reviewed. Allergies and medications reviewed and updated.  Review of Systems  Constitutional: Negative.   HENT: Negative.    Respiratory:  Positive for cough. Negative  for apnea, choking, chest tightness, shortness of breath, wheezing and stridor.   Cardiovascular: Negative.   Gastrointestinal: Negative.   Musculoskeletal: Negative.   Psychiatric/Behavioral: Negative.      Per HPI unless specifically indicated above     Objective:    BP (!) 148/84   Pulse 81   Ht 5' 11.5" (1.816 m)   Wt 252 lb 9.6 oz (114.6 kg)   SpO2 98%   BMI 34.74 kg/m   Wt Readings from Last 3 Encounters:  06/17/23 252 lb 9.6 oz (114.6 kg)  05/13/23 265 lb 9.6 oz (120.5 kg)   05/05/23 265 lb 9.6 oz (120.5 kg)    Physical Exam Vitals and nursing note reviewed.  Constitutional:      General: He is not in acute distress.    Appearance: Normal appearance. He is obese. He is not ill-appearing, toxic-appearing or diaphoretic.  HENT:     Head: Normocephalic and atraumatic.     Right Ear: External ear normal. There is impacted cerumen.     Left Ear: Tympanic membrane, ear canal and external ear normal.     Nose: Congestion present. No rhinorrhea.     Mouth/Throat:     Mouth: Mucous membranes are moist.     Pharynx: Oropharynx is clear. Posterior oropharyngeal erythema present. No oropharyngeal exudate.  Eyes:     General: No scleral icterus.       Right eye: No discharge.        Left eye: No discharge.     Extraocular Movements: Extraocular movements intact.     Conjunctiva/sclera: Conjunctivae normal.     Pupils: Pupils are equal, round, and reactive to light.  Cardiovascular:     Rate and Rhythm: Normal rate and regular rhythm.     Pulses: Normal pulses.     Heart sounds: Normal heart sounds. No murmur heard.    No friction rub. No gallop.  Pulmonary:     Effort: Pulmonary effort is normal. No respiratory distress.     Breath sounds: Normal breath sounds. No stridor. No wheezing, rhonchi or rales.  Chest:     Chest wall: No tenderness.  Musculoskeletal:        General: Normal range of motion.     Cervical back: Normal range of motion and neck supple.  Skin:    General: Skin is warm and dry.     Capillary Refill: Capillary refill takes less than 2 seconds.     Coloration: Skin is not jaundiced or pale.     Findings: No bruising, erythema, lesion or rash.  Neurological:     General: No focal deficit present.     Mental Status: He is alert and oriented to person, place, and time. Mental status is at baseline.  Psychiatric:        Mood and Affect: Mood normal.        Behavior: Behavior normal.        Thought Content: Thought content normal.         Judgment: Judgment normal.     Results for orders placed or performed during the hospital encounter of 05/13/23  CBC  Result Value Ref Range   WBC 12.1 (H) 4.0 - 10.5 K/uL   RBC 5.00 4.22 - 5.81 MIL/uL   Hemoglobin 15.7 13.0 - 17.0 g/dL   HCT 16.1 09.6 - 04.5 %   MCV 93.4 80.0 - 100.0 fL   MCH 31.4 26.0 - 34.0 pg   MCHC 33.6 30.0 - 36.0 g/dL  RDW 12.3 11.5 - 15.5 %   Platelets 323 150 - 400 K/uL   nRBC 0.0 0.0 - 0.2 %  Basic metabolic panel  Result Value Ref Range   Sodium 134 (L) 135 - 145 mmol/L   Potassium 3.8 3.5 - 5.1 mmol/L   Chloride 101 98 - 111 mmol/L   CO2 22 22 - 32 mmol/L   Glucose, Bld 124 (H) 70 - 99 mg/dL   BUN 10 6 - 20 mg/dL   Creatinine, Ser 1.61 0.61 - 1.24 mg/dL   Calcium 8.8 (L) 8.9 - 10.3 mg/dL   GFR, Estimated >09 >60 mL/min   Anion gap 11 5 - 15  Magnesium  Result Value Ref Range   Magnesium 1.9 1.7 - 2.4 mg/dL  CBC  Result Value Ref Range   WBC 7.9 4.0 - 10.5 K/uL   RBC 4.83 4.22 - 5.81 MIL/uL   Hemoglobin 15.2 13.0 - 17.0 g/dL   HCT 45.4 09.8 - 11.9 %   MCV 92.8 80.0 - 100.0 fL   MCH 31.5 26.0 - 34.0 pg   MCHC 33.9 30.0 - 36.0 g/dL   RDW 14.7 82.9 - 56.2 %   Platelets 269 150 - 400 K/uL   nRBC 0.0 0.0 - 0.2 %  Surgical pathology  Result Value Ref Range   SURGICAL PATHOLOGY      SURGICAL PATHOLOGY CASE: [id number] PATIENT: George Rodriguez Surgical Pathology Report     Clinical History: Colostomy (crm)     FINAL MICROSCOPIC DIAGNOSIS:  A. COLOSTOMY, TAKEDOWN: Colocutaneous anastomosis showing erosion, ulceration and reactive mucosal changes Negative for dysplasia and malignancy   GROSS DESCRIPTION:  Received fresh is a 5.5 cm in length by 3.1 cm in diameter portion of bowel.  The external surface is minimally roughened with attached well lobulated yellow adipose.  One end of the bowel is surrounded by a thin rim of tan wrinkled skin.  Sectioning the specimen reveals a pink-tan, glistening mucosa without distinct  lesions.  The wall has a thickness of 0.5 cm.  Representative sections including skin are submitted in 1 block. Renette Butters, 05/13/2023)   Final Diagnosis performed by Jerene Bears, MD.   Electronically signed 05/16/2023 Technical component performed at [facility], [address] [place], [place] [address].  Professional component performed at [employer]. [facility], [address] [place], [place] [address].  Immunohistochemistry Technical component (if applicable) was performed at [facility]. [address], [place], [place] [address].   IMMUNOHISTOCHEMISTRY DISCLAIMER (if applicable): Some of these immunohistochemical stains may have been developed and the performance characteristics determine by [facility]. Some may not have been cleared or approved by the U.S. Food and Drug Administration. The FDA has determined that such clearance or approval is not necessary. This test is used for clinical purposes. It should not be regarded as investigational or for research. This laboratory is certified under the Clinical Laboratory Improvement Amendments of 1988 (CLIA-88) as qualified to perform high complexity clinical laboratory testing.  The controls stained appropriately.   IHC stains are performed on formalin fixed, par affin embedded tissue using a 3,3"diaminobenzidine (DAB) chromogen and Leica Bond Autostainer System. The staining intensity of the nucleus is score manually and is reported as the percentage of tumor cell nuclei demonstrating specific nuclear staining. The specimens are fixed in 10% Neutral Formalin for at least 6 hours and up to 72hrs. These tests are validated on decalcified tissue. Results should be interpreted with caution  given the possibility of false negative results on decalcified specimens. Antibody Clones are as follows ER-clone 49F, PR-clone 16, Ki67- clone MM1. Some of  these immunohistochemical stains may have been developed and the performance characteristics determined by [facility].       Assessment & Plan:   Problem List Items Addressed This Visit       Cardiovascular and Mediastinum   Primary hypertension - Primary    Still running high. Will increase his lisinopril to 20mg  and recheck 1 month. Call with any concerns. Labs drawn today.       Relevant Medications   lisinopril (ZESTRIL) 20 MG tablet   Other Relevant Orders   Basic metabolic panel     Other   Anxiety    Doing well off sertraline. Continue hydroxyzine as needed. Call with any concerns.       Other Visit Diagnoses     Upper respiratory tract infection, unspecified type       Will treat with tessalon perles. Call if not getting better or getting worse.        Follow up plan: Return in about 4 weeks (around 07/15/2023).

## 2023-06-17 NOTE — Assessment & Plan Note (Signed)
Doing well off sertraline. Continue hydroxyzine as needed. Call with any concerns.

## 2023-06-17 NOTE — Assessment & Plan Note (Signed)
Still running high. Will increase his lisinopril to 20mg  and recheck 1 month. Call with any concerns. Labs drawn today.

## 2023-06-18 LAB — BASIC METABOLIC PANEL
BUN/Creatinine Ratio: 11 (ref 9–20)
BUN: 10 mg/dL (ref 6–20)
CO2: 20 mmol/L (ref 20–29)
Calcium: 9.6 mg/dL (ref 8.7–10.2)
Chloride: 107 mmol/L — ABNORMAL HIGH (ref 96–106)
Creatinine, Ser: 0.95 mg/dL (ref 0.76–1.27)
Glucose: 93 mg/dL (ref 70–99)
Potassium: 3.9 mmol/L (ref 3.5–5.2)
Sodium: 143 mmol/L (ref 134–144)
eGFR: 110 mL/min/{1.73_m2} (ref >59)

## 2023-07-18 ENCOUNTER — Ambulatory Visit: Payer: BC Managed Care – PPO | Admitting: Family Medicine

## 2023-07-28 ENCOUNTER — Ambulatory Visit: Payer: BC Managed Care – PPO | Admitting: Family Medicine

## 2023-07-31 ENCOUNTER — Other Ambulatory Visit: Payer: Self-pay | Admitting: Family Medicine

## 2023-08-02 NOTE — Telephone Encounter (Signed)
Refused Lisinopril 10 mg because the dose has been changed to 20 mg on 06/17/2023.   She has not come in for her follow up appts.

## 2023-08-11 ENCOUNTER — Telehealth: Payer: Self-pay

## 2023-08-11 NOTE — Telephone Encounter (Signed)
 [facility] is denying Dupixent coverage due to office notes showing patient using in combination with Opzelura. He does have follow up 08/29/23. aw

## 2023-08-15 NOTE — Telephone Encounter (Signed)
Left message for patient to return my call. aw 

## 2023-08-17 NOTE — Telephone Encounter (Signed)
 Patient has been advised.  He has one injection left and due tomorrow. Advised patient we can help provide sample when he comes in for office visit. aw

## 2023-08-19 ENCOUNTER — Ambulatory Visit: Payer: 59 | Admitting: Family Medicine

## 2023-08-29 ENCOUNTER — Ambulatory Visit: Payer: 59 | Admitting: Family Medicine

## 2023-08-29 ENCOUNTER — Ambulatory Visit: Payer: 59 | Admitting: Dermatology

## 2023-08-29 VITALS — BP 131/76 | HR 90 | Temp 97.9°F | Wt 258.0 lb

## 2023-08-29 DIAGNOSIS — I10 Essential (primary) hypertension: Secondary | ICD-10-CM

## 2023-08-29 DIAGNOSIS — J4520 Mild intermittent asthma, uncomplicated: Secondary | ICD-10-CM | POA: Diagnosis not present

## 2023-08-29 DIAGNOSIS — L209 Atopic dermatitis, unspecified: Secondary | ICD-10-CM | POA: Diagnosis not present

## 2023-08-29 DIAGNOSIS — Z79899 Other long term (current) drug therapy: Secondary | ICD-10-CM | POA: Diagnosis not present

## 2023-08-29 MED ORDER — LEVOCETIRIZINE DIHYDROCHLORIDE 5 MG PO TABS: 5.0000 mg | ORAL_TABLET | Freq: Every evening | ORAL | 5 refills | Status: DC

## 2023-08-29 MED ORDER — SERNIVO 0.05 % EX EMUL: CUTANEOUS | 5 refills | Status: AC

## 2023-08-29 MED ORDER — DUPIXENT 300 MG/2ML ~~LOC~~ SOSY: 300.0000 mg | PREFILLED_SYRINGE | SUBCUTANEOUS | 6 refills | Status: DC

## 2023-08-29 MED ORDER — LISINOPRIL 20 MG PO TABS: 20.0000 mg | ORAL_TABLET | Freq: Every day | ORAL | 1 refills | Status: DC

## 2023-08-29 MED ORDER — ALBUTEROL SULFATE HFA 108 (90 BASE) MCG/ACT IN AERS: 2.0000 | INHALATION_SPRAY | Freq: Four times a day (QID) | RESPIRATORY_TRACT | 1 refills | Status: AC | PRN

## 2023-08-29 NOTE — Assessment & Plan Note (Signed)
Under good control on current regimen. Continue current regimen. Continue to monitor. Call with any concerns. Refills given. Labs drawn today.   

## 2023-08-29 NOTE — Progress Notes (Signed)
BP 131/76 (BP Location: Left Arm, Cuff Size: Normal)   Pulse 90   Temp 97.9 F (36.6 C)   Wt 258 lb (117 kg)   SpO2 98%   BMI 35.48 kg/m    Subjective:    Patient ID: George D Mederos, male    DOB: 16-Mar-1991, 33 y.o.   MRN: [id number]  HPI: George Rodriguez is a 33 y.o. male  Chief Complaint  Patient presents with   Hypertension   HYPERTENSION  Hypertension status: controlled  Satisfied with current treatment? yes Duration of hypertension: chronic BP monitoring frequency:  not checking BP medication side effects:  no Medication compliance: excellent compliance Previous BP meds: lisinopril Aspirin: no Recurrent headaches: no Visual changes: no Palpitations: no Dyspnea: no Chest pain: no Lower extremity edema: no Dizzy/lightheaded: no  Relevant past medical, surgical, family and social history reviewed and updated as indicated. Interim medical history since our last visit reviewed. Allergies and medications reviewed and updated.  Review of Systems  Constitutional: Negative.   Respiratory: Negative.    Cardiovascular: Negative.   Musculoskeletal: Negative.   Skin: Negative.   Psychiatric/Behavioral: Negative.      Per HPI unless specifically indicated above     Objective:    BP 131/76 (BP Location: Left Arm, Cuff Size: Normal)   Pulse 90   Temp 97.9 F (36.6 C)   Wt 258 lb (117 kg)   SpO2 98%   BMI 35.48 kg/m   Wt Readings from Last 3 Encounters:  08/29/23 258 lb (117 kg)  06/17/23 252 lb 9.6 oz (114.6 kg)  05/13/23 265 lb 9.6 oz (120.5 kg)    Physical Exam Vitals and nursing note reviewed.  Constitutional:      General: He is not in acute distress.    Appearance: Normal appearance. He is obese. He is not ill-appearing, toxic-appearing or diaphoretic.  HENT:     Head: Normocephalic and atraumatic.     Right Ear: External ear normal.     Left Ear: External ear normal.     Nose: Nose normal.     Mouth/Throat:     Mouth: Mucous membranes are  moist.     Pharynx: Oropharynx is clear.  Eyes:     General: No scleral icterus.       Right eye: No discharge.        Left eye: No discharge.     Extraocular Movements: Extraocular movements intact.     Conjunctiva/sclera: Conjunctivae normal.     Pupils: Pupils are equal, round, and reactive to light.  Cardiovascular:     Rate and Rhythm: Normal rate and regular rhythm.     Pulses: Normal pulses.     Heart sounds: Normal heart sounds. No murmur heard.    No friction rub. No gallop.  Pulmonary:     Effort: Pulmonary effort is normal. No respiratory distress.     Breath sounds: Normal breath sounds. No stridor. No wheezing, rhonchi or rales.  Chest:     Chest wall: No tenderness.  Musculoskeletal:        General: Normal range of motion.     Cervical back: Normal range of motion and neck supple.  Skin:    General: Skin is warm and dry.     Capillary Refill: Capillary refill takes less than 2 seconds.     Coloration: Skin is not jaundiced or pale.     Findings: No bruising, erythema, lesion or rash.  Neurological:     General: No  focal deficit present.     Mental Status: He is alert and oriented to person, place, and time. Mental status is at baseline.  Psychiatric:        Mood and Affect: Mood normal.        Behavior: Behavior normal.        Thought Content: Thought content normal.        Judgment: Judgment normal.     Results for orders placed or performed in visit on 06/17/23  Basic metabolic panel   Collection Time: 06/17/23  4:00 PM  Result Value Ref Range   Glucose 93 70 - 99 mg/dL   BUN 10 6 - 20 mg/dL   Creatinine, Ser 1.61 0.76 - 1.27 mg/dL   eGFR 096 >04 VW/UJW/1.19   BUN/Creatinine Ratio 11 9 - 20   Sodium 143 134 - 144 mmol/L   Potassium 3.9 3.5 - 5.2 mmol/L   Chloride 107 (H) 96 - 106 mmol/L   CO2 20 20 - 29 mmol/L   Calcium 9.6 8.7 - 10.2 mg/dL      Assessment & Plan:   Problem List Items Addressed This Visit       Cardiovascular and Mediastinum    Primary hypertension - Primary   Under good control on current regimen. Continue current regimen. Continue to monitor. Call with any concerns. Refills given. Labs drawn today.        Relevant Medications   lisinopril (ZESTRIL) 20 MG tablet   Other Relevant Orders   Basic metabolic panel     Respiratory   Asthma   Refill of albuterol given today.       Relevant Medications   albuterol (VENTOLIN HFA) 108 (90 Base) MCG/ACT inhaler     Follow up plan: Return in about 6 months (around 02/26/2024) for physical.

## 2023-08-29 NOTE — Patient Instructions (Addendum)
 For dry, irritated eyes, recommend OTC artificial tears lubricating eye drops, such as Natural Tears or Systane, and apply as needed during the day and before bed.  If that is not effective, can add OTC ketotifen eye drops (Zaditor) twice daily.   Dupilumab (Dupixent) is a treatment given by injection for adults and children with moderate-to-severe atopic dermatitis. Goal is control of skin condition, not cure. It is given as 2 injections at the first dose followed by 1 injection ever 2 weeks thereafter.  Young children are dosed monthly.  Potential side effects include allergic reaction, herpes infections, injection site reactions and conjunctivitis (inflammation of the eyes).  The use of Dupixent requires long term medication management, including periodic office visits.   Due to recent changes in healthcare laws, you may see results of your pathology and/or laboratory studies on MyChart before the doctors have had a chance to review them. We understand that in some cases there may be results that are confusing or concerning to you. Please understand that not all results are received at the same time and often the doctors may need to interpret multiple results in order to provide you with the best plan of care or course of treatment. Therefore, we ask that you please give [place] 2 business days to thoroughly review all your results before contacting the office for clarification. Should we see a critical lab result, you will be contacted sooner.   If You Need Anything After Your Visit  If you have any questions or concerns for your doctor, please call our main line at [phone] and press option 4 to reach your doctor's medical assistant. If no one answers, please leave a voicemail as directed and we will return your call as soon as possible. Messages left after 4 pm will be answered the following business day.   You may also send [place] a message via MyChart. We typically respond to MyChart messages  within 1-2 business days.  For prescription refills, please ask your pharmacy to contact our office. Our fax number is [phone].  If you have an urgent issue when the clinic is closed that cannot wait until the next business day, you can page your doctor at the number below.    Please note that while we do our best to be available for urgent issues outside of office hours, we are not available 24/7.   If you have an urgent issue and are unable to reach [place], you may choose to seek medical care at your doctor's office, retail clinic, urgent care center, or emergency room.  If you have a medical emergency, please immediately call 911 or go to the emergency department.  Pager Numbers  - Dr. Gwen Pounds: [phone]  - Dr. Roseanne Reno: [phone]  - Dr. Katrinka Blazing: [phone]   In the event of inclement weather, please call our main line at [phone] for an update on the status of any delays or closures.  Dermatology Medication Tips: Please keep the boxes that topical medications come in in order to help keep track of the instructions about where and how to use these. Pharmacies typically print the medication instructions only on the boxes and not directly on the medication tubes.   If your medication is too expensive, please contact our office at [phone] option 4 or send [place] a message through MyChart.   We are unable to tell what your co-pay for medications will be in advance as this is different depending on your insurance coverage. However, we may be able  to find a substitute medication at lower cost or fill out paperwork to get insurance to cover a needed medication.   If a prior authorization is required to get your medication covered by your insurance company, please allow [place] 1-2 business days to complete this process.  Drug prices often vary depending on where the prescription is filled and some pharmacies may offer cheaper prices.  The website www.goodrx.com contains coupons  for medications through different pharmacies. The prices here do not account for what the cost may be with help from insurance (it may be cheaper with your insurance), but the website can give you the price if you did not use any insurance.  - You can print the associated coupon and take it with your prescription to the pharmacy.  - You may also stop by our office during regular business hours and pick up a GoodRx coupon card.  - If you need your prescription sent electronically to a different pharmacy, notify our office through [facility] or by phone at [phone] option 4.     Si Usted Necesita Algo Despus de Su Visita  Tambin puede enviarnos un mensaje a travs de Clinical cytogeneticist. Por lo general respondemos a los mensajes de MyChart en el transcurso de 1 a 2 das hbiles.  Para renovar recetas, por favor pida a su farmacia que se ponga en contacto con nuestra oficina. George Rodriguez de fax es [place] [phone].  Si tiene un asunto urgente cuando la clnica est cerrada y que no puede esperar hasta el siguiente da hbil, puede llamar/localizar a su doctor(a) al nmero que aparece a continuacin.   Por favor, tenga en cuenta que aunque hacemos todo lo posible para estar disponibles para asuntos urgentes fuera del horario de [place], no estamos disponibles las 24 horas del da, los 7 [address]  [address] de la [place].   Si tiene un problema urgente y no puede comunicarse con nosotros, puede optar por buscar atencin mdica  en el consultorio de su doctor(a), en una clnica privada, en un centro de atencin urgente o en una sala de emergencias.  Si tiene Engineer, drilling, por favor llame inmediatamente al 911 o vaya a la sala de emergencias.  Nmeros de bper  - Dr. Gwen Pounds: [phone]  - Dra. Roseanne Reno: [phone]  - Dr. Katrinka Blazing: [phone]   En caso de inclemencias del tiempo, por favor llame a George Rodriguez principal al [phone] para una actualizacin sobre el [place] de cualquier  retraso o cierre.  Consejos para la medicacin en dermatologa: Por favor, guarde las cajas en las que vienen los medicamentos de uso tpico para ayudarle a seguir las instrucciones sobre dnde y cmo usarlos. Las farmacias generalmente imprimen las instrucciones del medicamento slo en las cajas y no directamente en los tubos del [place].   Si su medicamento es muy caro, por favor, pngase en contacto con Rolm [place] llamando al [phone] y presione la opcin 4 o envenos un mensaje a travs de Clinical cytogeneticist.   No podemos decirle cul ser su copago por los medicamentos por adelantado ya que esto es diferente dependiendo de la cobertura de su seguro. Sin embargo, es posible que podamos encontrar un medicamento sustituto a Audiological scientist un formulario para que el seguro cubra el medicamento que se considera necesario.   Si se requiere una autorizacin previa para que su compaa de seguros [place] su medicamento, por favor permtanos de 1 a 2 das hbiles para completar [address]  Los precios de los medicamentos varan con  frecuencia dependiendo del lugar de dnde se surte la receta y alguna farmacias pueden ofrecer precios ms baratos.  El sitio web www.goodrx.com tiene cupones para medicamentos de Health and safety inspector. Los precios aqu no tienen en cuenta lo que podra costar con la ayuda del seguro (puede ser ms barato con su seguro), pero el sitio web puede darle el precio si no utiliz Tourist information centre manager.  - Puede imprimir el cupn correspondiente y llevarlo con su receta a la farmacia.  - Tambin puede pasar por nuestra oficina durante el horario de atencin regular y Education officer, museum una tarjeta de cupones de GoodRx.  - Si necesita que su receta se enve electrnicamente a una farmacia diferente, informe a nuestra oficina a travs de MyChart de  o por telfono llamando al [phone] y presione la opcin 4.

## 2023-08-29 NOTE — Assessment & Plan Note (Signed)
Refill of albuterol given today.

## 2023-08-29 NOTE — Progress Notes (Signed)
Follow-Up Visit   Subjective  George Rodriguez is a 33 y.o. male who presents for the following: Atopic Dermatitis of the arms, face, back - currently clear per patient. He is on Dupixent injections every 2 weeks. He has had eye irritation/dryness for the past 3 weeks. He has been on Dupixent 6 years and has never had this problem before. He uses clobetasol foam as needed for flares for body. No longer using Opzelura cream.   The following portions of the chart were reviewed this encounter and updated as appropriate: medications, allergies, medical history  Review of Systems:  No other skin or systemic complaints except as noted in HPI or Assessment and Plan.  Objective  Well appearing patient in no apparent distress; mood and affect are within normal limits.  Areas Examined: Face, trunk  Relevant physical exam findings are noted in the Assessment and Plan.    Assessment & Plan    ATOPIC DERMATITIS Exam: Mild lichenification bilateral antecubitum/wrist with mild erythema; pink scaly patches on the forearm, wrist; pink scaly patches infraocular 6% BSA  Chronic and persistent condition with duration or expected duration over one year. Condition is improved with treatment but not currently at goal. Although patient is satisfied and says he is much better from baseline.  Atopic dermatitis - Severe, on Dupixent (biologic medication).  Atopic dermatitis (eczema) is a chronic, relapsing, pruritic condition that can significantly affect quality of life. It is often associated with allergic rhinitis and/or asthma and can require treatment with topical medications, phototherapy, or in severe cases biologic medications, which require long term medication management.    Treatment Plan: d/c Opzelura cream Start Sernivo spray Apply to AA body once to twice daily as needed for flares. Rx sent to [facility]. Sample given Lot WCBN-1 Exp 2/25 Or Continue clobetasol foam 1-2 times daily as needed for  flares. Avoid applying to face, groin, and axilla. Use as directed. Long-term use can cause thinning of the skin.  Topical steroids (such as triamcinolone, fluocinolone, fluocinonide, mometasone, clobetasol, halobetasol, betamethasone, hydrocortisone) can cause thinning and lightening of the skin if they are used for too long in the same area. Your physician has selected the right strength medicine for your problem and area affected on the body. Please use your medication only as directed by your physician to prevent side effects.    Continue Dupixent 300 mg/14mL SQ QOW. Patient with eye irritation x 3 weeks, no other side effects.  Patient has appointment with eye doctor this week.  If eye irritation doesn't improve, consider changing to another biologic.  Sample Dupixent injection (syringe) given to patient, while pt awaits insurance approval for new year.  For dry, irritated eyes, recommend OTC artificial tears lubricating eye drops, such as Natural Tears or Systane, and apply as needed during the day and before bed.  If that is not effective, can add OTC ketotifen eye drops (Zaditor) twice daily. May use Aquaphor ointment around eyes for dry, irritated skin.  Start Xyzal 5 mg PO qam  Potential side effects Dupixent include allergic reaction, herpes infections, injection site reactions and conjunctivitis (inflammation of the eyes).  The use of Dupixent requires long term medication management, including periodic office visits.    Long term medication management.  Patient is using long term (months to years) prescription medication  to control their dermatologic condition.  These medications require periodic monitoring to evaluate for efficacy and side effects and may require periodic laboratory monitoring.   Recommend gentle skin care.  Return in about 6 months (around 02/26/2024) for Atopic Dermatitis, Dupixent.  ICherlyn Labella, CMA, am acting as scribe for Willeen Niece, MD  .   Documentation: I have reviewed the above documentation for accuracy and completeness, and I agree with the above.  Willeen Niece, MD

## 2023-08-30 ENCOUNTER — Encounter: Payer: Self-pay | Admitting: Family Medicine

## 2023-08-30 LAB — BASIC METABOLIC PANEL
BUN/Creatinine Ratio: 10 (ref 9–20)
BUN: 9 mg/dL (ref 6–20)
CO2: 24 mmol/L (ref 20–29)
Calcium: 9.9 mg/dL (ref 8.7–10.2)
Chloride: 102 mmol/L (ref 96–106)
Creatinine, Ser: 0.93 mg/dL (ref 0.76–1.27)
Glucose: 79 mg/dL (ref 70–99)
Potassium: 4.7 mmol/L (ref 3.5–5.2)
Sodium: 143 mmol/L (ref 134–144)
eGFR: 113 mL/min/{1.73_m2} (ref >59)

## 2023-10-26 ENCOUNTER — Encounter

## 2023-11-08 ENCOUNTER — Telehealth: Admitting: Physician Assistant

## 2023-11-08 DIAGNOSIS — H0100B Unspecified blepharitis left eye, upper and lower eyelids: Secondary | ICD-10-CM

## 2023-11-08 DIAGNOSIS — H1030 Unspecified acute conjunctivitis, unspecified eye: Secondary | ICD-10-CM

## 2023-11-08 DIAGNOSIS — H1012 Acute atopic conjunctivitis, left eye: Secondary | ICD-10-CM | POA: Diagnosis not present

## 2023-11-08 MED ORDER — OLOPATADINE HCL 0.1 % OP SOLN: 1.0000 [drp] | Freq: Two times a day (BID) | OPHTHALMIC | 0 refills | Status: AC

## 2023-11-08 MED ORDER — ERYTHROMYCIN 5 MG/GM OP OINT: 1.0000 | TOPICAL_OINTMENT | Freq: Every day | OPHTHALMIC | 0 refills | Status: DC

## 2023-11-08 MED ORDER — POLYMYXIN B-TRIMETHOPRIM 10000-0.1 UNIT/ML-% OP SOLN: OPHTHALMIC | 0 refills | Status: DC

## 2023-11-08 NOTE — Progress Notes (Signed)
 I have spent 5 minutes in review of e-visit questionnaire, review and updating patient chart, medical decision making and response to patient.   Piedad Climes, PA-C

## 2023-11-08 NOTE — Progress Notes (Signed)
 Virtual Visit Consent   George Rodriguez, you are scheduled for a virtual visit with a Central provider today. Just as with appointments in the office, your consent must be obtained to participate. Your consent will be active for this visit and any virtual visit you may have with one of our providers in the next 365 days. If you have a MyChart account, a copy of this consent can be sent to you electronically.  As this is a virtual visit, video technology does not allow for your provider to perform a traditional examination. This may limit your provider's ability to fully assess your condition. If your provider identifies any concerns that need to be evaluated in person or the need to arrange testing (such as labs, EKG, etc.), we will make arrangements to do so. Although advances in technology are sophisticated, we cannot ensure that it will always work on either your end or our end. If the connection with a video visit is poor, the visit may have to be switched to a telephone visit. With either a video or telephone visit, we are not always able to ensure that we have a secure connection.  By engaging in this virtual visit, you consent to the provision of healthcare and authorize for your insurance to be billed (if applicable) for the services provided during this visit. Depending on your insurance coverage, you may receive a charge related to this service.  I need to obtain your verbal consent now. Are you willing to proceed with your visit today? George Rodriguez has provided verbal consent on 11/08/2023 for a virtual visit (video or telephone). George Rodriguez, [place]  Date: 11/08/2023 4:52 PM   Virtual Visit via Video Note   I, George Rodriguez, connected with  George Rodriguez  ([id number], 31-Aug-1990) on 11/08/23 at  4:45 PM EDT by a video-enabled telemedicine application and verified that I am speaking with the correct person using two identifiers.  Location: Patient: Virtual Visit Location Patient:  Home Provider: Virtual Visit Location Provider: [place]   I discussed the limitations of evaluation and management by telemedicine and the availability of in person appointments. The patient expressed understanding and agreed to proceed.    History of Present Illness: George D Plancarte is a 33 y.o. who identifies as a male who was assigned male at birth, and is being seen today for left eyelid swelling and redness.  HPI: 33 y/o M presents via video telehealth visit for c/o swelling of upper and lower eyelid with matting in the AM for the past 7-1o days. Denies cold symptoms. Denies known exposure to pink eye. Has seasonal allergies and takes otc anti-histamines. Denies eye redness, pain, or photophobia.   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   Diverticulitis 09/27/2022   Asthma 07/13/2017   Allergic rhinitis 07/13/2017    Allergies: No Known Allergies Medications:  Current Outpatient Medications:    erythromycin ophthalmic ointment, Place 1 Application into the left eye at bedtime., Disp: 3.5 g, Rfl: 0   olopatadine (PATANOL) 0.1 % ophthalmic solution, Place 1 drop into the left eye 2 (two) times daily., Disp: 5 mL, Rfl: 0   acetaminophen (TYLENOL) 500 MG tablet, Take 2 tablets (1,000 mg total) by mouth  every 6 (six) hours as needed., Disp: 30 tablet, Rfl: 0   albuterol (VENTOLIN HFA) 108 (90 Base) MCG/ACT inhaler, Inhale 2 puffs into the lungs every 6 (six) hours as needed for wheezing or shortness of breath., Disp: 18 g, Rfl: 1   Betamethasone Dipropionate (SERNIVO) 0.05 % EMUL, Apply once to twice daily to affected areas rash on body. Avoid applying to face, groin, and axilla. Use as directed. Long-term use can  cause thinning of the skin., Disp: 120 mL, Rfl: 5   clobetasol (OLUX) 0.05 % topical foam, Apply topically as directed. Daily to bid up to 2 weeks to more severe areas of eczema on body, avoid face, groin, axilla, Disp: 100 g, Rfl: 4   Clobetasol Propionate Emulsion 0.05 % topical foam, Apply BID to affected areas on body up to 2 weeks as needed. Avoid applying to face, groin, and axilla., Disp: 100 g, Rfl: 2   dupilumab (DUPIXENT) 300 MG/2ML prefilled syringe, Inject 300 mg into the skin every 14 (fourteen) days. Starting at day 15 for maintenance., Disp: 4 mL, Rfl: 6   hydrOXYzine (VISTARIL) 25 MG capsule, Take 1 capsule (25 mg total) by mouth every 8 (eight) hours as needed., Disp: 90 capsule, Rfl: 1   ibuprofen (ADVIL) 200 MG tablet, Take 600 mg by mouth every 6 (six) hours as needed for moderate pain., Disp: , Rfl:    levocetirizine (XYZAL) 5 MG tablet, Take 1 tablet (5 mg total) by mouth every evening., Disp: 30 tablet, Rfl: 5   lisinopril (ZESTRIL) 20 MG tablet, Take 1 tablet (20 mg total) by mouth daily., Disp: 90 tablet, Rfl: 1   methocarbamol (ROBAXIN-750) 750 MG tablet, Take 1 tablet (750 mg total) by mouth every 6 (six) hours as needed for muscle spasms (pain)., Disp: 20 tablet, Rfl: 0   Ruxolitinib Phosphate (OPZELURA) 1.5 % CREA, Apply 1 Application topically as directed. Daily to bid to eczema on face and body as needed for flares, Disp: 60 g, Rfl: 6   trimethoprim-polymyxin b (POLYTRIM) ophthalmic solution, Apply 1-2 drops into affected eye QID x 5 days., Disp: 10 mL, Rfl: 0  Observations/Objective: Patient is well-developed, well-nourished in no acute distress.  Resting comfortably  at home.  Head is normocephalic, atraumatic.  No labored breathing.  Speech is clear and coherent with logical content.  Patient is alert and oriented at baseline.    Assessment and Plan: 1. Blepharitis of both upper and lower eyelid of left eye, unspecified type (Primary) - erythromycin ophthalmic  ointment; Place 1 Application into the left eye at bedtime.  Dispense: 3.5 g; Refill: 0  2. Allergic conjunctivitis of left eye - olopatadine (PATANOL) 0.1 % ophthalmic solution; Place 1 drop into the left eye 2 (two) times daily.  Dispense: 5 mL; Refill: 0  Apply warm compresses/cold packs to the eyelid for pain relief Apply eye ointment as prescribed. Use anti-histamine eye drops as prescribed Take otc Benadryl for eyelid swelling Continue to watch for worsening symptoms. Schedule a virtual appointment if symptoms don't improve.   Pt verbalized understanding and in agreement.    Follow Up Instructions: I discussed the assessment and treatment plan with the patient. The patient was provided an opportunity to ask questions and all were answered. The patient agreed with the plan and demonstrated an understanding of the instructions.  A copy of instructions were sent to the patient via MyChart unless otherwise noted below.   Patient has requested to receive PHI (AVS, Work Notes, etc) pertaining to this  video visit through e-mail as they are currently without active MyChart. They have voiced understand that email is not considered secure and their health information could be viewed by someone other than the patient.   The patient was advised to call back or seek an in-person evaluation if the symptoms worsen or if the condition fails to improve as anticipated.    George Better, PA-C

## 2023-11-08 NOTE — Progress Notes (Signed)

## 2023-11-08 NOTE — Patient Instructions (Signed)
 George Rodriguez, thank you for joining Gilberto Better, PA-C for today's virtual visit.  While this provider is not your primary care provider (PCP), if your PCP is located in our provider database this encounter information will be shared with them immediately following your visit.   A [facility] MyChart account gives you access to today's visit and all your visits, tests, and labs performed at [facility] " click here if you don't have a [facility] MyChart account or go to [email or web address]  Consent: (Patient) George Rodriguez provided verbal consent for this virtual visit at the beginning of the encounter.  Current Medications:  Current Outpatient Medications:    erythromycin ophthalmic ointment, Place 1 Application into the left eye at bedtime., Disp: 3.5 g, Rfl: 0   olopatadine (PATANOL) 0.1 % ophthalmic solution, Place 1 drop into the left eye 2 (two) times daily., Disp: 5 mL, Rfl: 0   acetaminophen (TYLENOL) 500 MG tablet, Take 2 tablets (1,000 mg total) by mouth every 6 (six) hours as needed., Disp: 30 tablet, Rfl: 0   albuterol (VENTOLIN HFA) 108 (90 Base) MCG/ACT inhaler, Inhale 2 puffs into the lungs every 6 (six) hours as needed for wheezing or shortness of breath., Disp: 18 g, Rfl: 1   Betamethasone Dipropionate (SERNIVO) 0.05 % EMUL, Apply once to twice daily to affected areas rash on body. Avoid applying to face, groin, and axilla. Use as directed. Long-term use can cause thinning of the skin., Disp: 120 mL, Rfl: 5   clobetasol (OLUX) 0.05 % topical foam, Apply topically as directed. Daily to bid up to 2 weeks to more severe areas of eczema on body, avoid face, groin, axilla, Disp: 100 g, Rfl: 4   Clobetasol Propionate Emulsion 0.05 % topical foam, Apply BID to affected areas on body up to 2 weeks as needed. Avoid applying to face, groin, and axilla., Disp: 100 g, Rfl: 2   dupilumab (DUPIXENT) 300 MG/2ML prefilled syringe, Inject 300 mg into the skin every 14  (fourteen) days. Starting at day 15 for maintenance., Disp: 4 mL, Rfl: 6   hydrOXYzine (VISTARIL) 25 MG capsule, Take 1 capsule (25 mg total) by mouth every 8 (eight) hours as needed., Disp: 90 capsule, Rfl: 1   ibuprofen (ADVIL) 200 MG tablet, Take 600 mg by mouth every 6 (six) hours as needed for moderate pain., Disp: , Rfl:    levocetirizine (XYZAL) 5 MG tablet, Take 1 tablet (5 mg total) by mouth every evening., Disp: 30 tablet, Rfl: 5   lisinopril (ZESTRIL) 20 MG tablet, Take 1 tablet (20 mg total) by mouth daily., Disp: 90 tablet, Rfl: 1   methocarbamol (ROBAXIN-750) 750 MG tablet, Take 1 tablet (750 mg total) by mouth every 6 (six) hours as needed for muscle spasms (pain)., Disp: 20 tablet, Rfl: 0   Ruxolitinib Phosphate (OPZELURA) 1.5 % CREA, Apply 1 Application topically as directed. Daily to bid to eczema on face and body as needed for flares, Disp: 60 g, Rfl: 6   trimethoprim-polymyxin b (POLYTRIM) ophthalmic solution, Apply 1-2 drops into affected eye QID x 5 days., Disp: 10 mL, Rfl: 0   Medications ordered in this encounter:  Meds ordered this encounter  Medications   erythromycin ophthalmic ointment    Sig: Place 1 Application into the left eye at bedtime.    Dispense:  3.5 g    Refill:  0    Supervising Provider:   Merrilee Jansky [[id number]]   olopatadine (PATANOL) 0.1 % ophthalmic solution  Sig: Place 1 drop into the left eye 2 (two) times daily.    Dispense:  5 mL    Refill:  0    Supervising Provider:   Merrilee Jansky [[id number]]     *If you need refills on other medications prior to your next appointment, please contact your pharmacy*  Follow-Up: Call back or seek an in-person evaluation if the symptoms worsen or if the condition fails to improve as anticipated.  [facility] [phone]  Other Instructions Apply warm compresses/cold packs to the eyelid for pain relief Apply eye ointment as prescribed. Use anti-histamine eye drops as  prescribed Take otc Benadryl for eyelid swelling Continue to watch for worsening symptoms. Schedule a virtual appointment if symptoms don't improve.     If you have been instructed to have an in-person evaluation today at a local Urgent Care facility, please use the link below. It will take you to a list of all of our available [place] [facility] [facility], including address, phone number and hours of operation. Please do not delay care.  [facility]  If you or a family member do not have a primary care provider, use the link below to schedule a visit and establish care. When you choose a [facility] primary care physician or advanced practice provider, you gain a long-term partner in health. Find a Primary Care Provider  Learn more about [facility]'s in-office and virtual care options: [facility] - Get Care Now

## 2023-11-21 ENCOUNTER — Telehealth: Payer: Self-pay

## 2023-11-21 NOTE — Telephone Encounter (Signed)
 Patient called to let Dr Annette Barters know his left eye has been pink crusty and swelling for 1 month now, he is not sure if this is coming from Dupixent injections he has been taking.  Patient had a video visit with his PCP 11/08/2023 and was prescribed olopatadine ophthalmic drops and erythromycin ointment- no help

## 2023-11-23 ENCOUNTER — Ambulatory Visit: Admitting: Dermatology

## 2023-11-23 ENCOUNTER — Other Ambulatory Visit: Payer: Self-pay

## 2023-11-23 DIAGNOSIS — L2089 Other atopic dermatitis: Secondary | ICD-10-CM

## 2023-11-23 DIAGNOSIS — Z79899 Other long term (current) drug therapy: Secondary | ICD-10-CM

## 2023-11-23 DIAGNOSIS — L209 Atopic dermatitis, unspecified: Secondary | ICD-10-CM

## 2023-11-23 DIAGNOSIS — H44002 Unspecified purulent endophthalmitis, left eye: Secondary | ICD-10-CM

## 2023-11-23 MED ORDER — DOXYCYCLINE MONOHYDRATE 100 MG PO CAPS: 100.0000 mg | ORAL_CAPSULE | Freq: Two times a day (BID) | ORAL | 0 refills | Status: AC

## 2023-11-23 MED ORDER — TOBRAMYCIN 0.3 % OP SOLN: OPHTHALMIC | 0 refills | Status: DC

## 2023-11-23 NOTE — Patient Instructions (Addendum)
 For dry, irritated eyes, recommend OTC artificial tears lubricating eye drops, such as Natural Tears or Systane, and apply as needed during the day and before bed.  If that is not effective, can add OTC ketotifen eye drops (Zaditor) twice daily.  Due to recent changes in healthcare laws, you may see results of your pathology and/or laboratory studies on MyChart before the doctors have had a chance to review them. We understand that in some cases there may be results that are confusing or concerning to you. Please understand that not all results are received at the same time and often the doctors may need to interpret multiple results in order to provide you with the best plan of care or course of treatment. Therefore, we ask that you please give [place] 2 business days to thoroughly review all your results before contacting the office for clarification. Should we see a critical lab result, you will be contacted sooner.   If You Need Anything After Your Visit  If you have any questions or concerns for your doctor, please call our main line at [phone] and press option 4 to reach your doctor's medical assistant. If no one answers, please leave a voicemail as directed and we will return your call as soon as possible. Messages left after 4 pm will be answered the following business day.   You may also send [place] a message via MyChart. We typically respond to MyChart messages within 1-2 business days.  For prescription refills, please ask your pharmacy to contact our office. Our fax number is [phone].  If you have an urgent issue when the clinic is closed that cannot wait until the next business day, you can page your doctor at the number below.    Please note that while we do our best to be available for urgent issues outside of office hours, we are not available 24/7.   If you have an urgent issue and are unable to reach [place], you may choose to seek medical care at your doctor's office, retail clinic,  urgent care center, or emergency room.  If you have a medical emergency, please immediately call 911 or go to the emergency department.  Pager Numbers  - Dr. Gwen Pounds: [phone]  - Dr. Roseanne Reno: [phone]  - Dr. Katrinka Blazing: [phone]   In the event of inclement weather, please call our main line at [phone] for an update on the status of any delays or closures.  Dermatology Medication Tips: Please keep the boxes that topical medications come in in order to help keep track of the instructions about where and how to use these. Pharmacies typically print the medication instructions only on the boxes and not directly on the medication tubes.   If your medication is too expensive, please contact our office at [phone] option 4 or send [place] a message through MyChart.   We are unable to tell what your co-pay for medications will be in advance as this is different depending on your insurance coverage. However, we may be able to find a substitute medication at lower cost or fill out paperwork to get insurance to cover a needed medication.   If a prior authorization is required to get your medication covered by your insurance company, please allow [place] 1-2 business days to complete this process.  Drug prices often vary depending on where the prescription is filled and some pharmacies may offer cheaper prices.  The website www.goodrx.com contains coupons for medications through different pharmacies. The prices here do not account  for what the cost may be with help from insurance (it may be cheaper with your insurance), but the website can give you the price if you did not use any insurance.  - You can print the associated coupon and take it with your prescription to the pharmacy.  - You may also stop by our office during regular business hours and pick up a GoodRx coupon card.  - If you need your prescription sent electronically to a different pharmacy, notify our office through [facility] or by phone at [phone] option 4.     Si Usted Necesita Algo Despus de Su Visita  Tambin puede enviarnos un mensaje a travs de Clinical cytogeneticist. Por lo general respondemos a los mensajes de MyChart en el transcurso de 1 a 2 das hbiles.  Para renovar recetas, por favor pida a su farmacia que se ponga en contacto con nuestra oficina. [name] de fax es [place] [phone].  Si tiene un asunto urgente cuando la clnica est cerrada y que no puede esperar hasta el siguiente da hbil, puede llamar/localizar a su doctor(a) al nmero que aparece a continuacin.   Por favor, tenga en cuenta que aunque hacemos todo lo posible para estar disponibles para asuntos urgentes fuera del horario de [place], no estamos disponibles las 24 horas del da, los 7 [address]  [address] de la [place].   Si tiene un problema urgente y no puede comunicarse con nosotros, puede optar por buscar atencin mdica  en el consultorio de su doctor(a), en una clnica privada, en un centro de atencin urgente o en una sala de emergencias.  Si tiene Engineer, drilling, por favor llame inmediatamente al 911 o vaya a la sala de emergencias.  Nmeros de bper  - Dr. Gwen Pounds: [phone]  - Dra. Roseanne Reno: [phone]  - Dr. Katrinka Blazing: [phone]   En caso de inclemencias del tiempo, por favor llame a Lacy Duverney principal al [phone] para una actualizacin sobre el [place] de cualquier retraso o cierre.  Consejos para la medicacin en dermatologa: Por favor, guarde las cajas en las que vienen los medicamentos de uso tpico para ayudarle a seguir las instrucciones sobre dnde y cmo usarlos. Las farmacias generalmente imprimen las instrucciones del medicamento slo en las cajas y no directamente en los tubos del [place].   Si su medicamento es muy caro, por favor, pngase en contacto con Rolm Gala llamando al [phone] y presione la opcin 4 o envenos un mensaje a travs de Clinical cytogeneticist.   No podemos decirle cul  ser su copago por los medicamentos por adelantado ya que esto es diferente dependiendo de la cobertura de su seguro. Sin embargo, es posible que podamos encontrar un medicamento sustituto a Audiological scientist un formulario para que el seguro cubra el medicamento que se considera necesario.   Si se requiere una autorizacin previa para que su compaa de seguros [place] su medicamento, por favor permtanos de 1 a 2 das hbiles para completar [address]  Los precios de los medicamentos varan con frecuencia dependiendo del Environmental consultant de dnde se surte la receta y alguna farmacias pueden ofrecer precios ms baratos.  El sitio web www.goodrx.com tiene cupones para medicamentos de Health and safety inspector. Los precios aqu no tienen en cuenta lo que podra costar con la ayuda del seguro (puede ser ms barato con su seguro), pero el sitio web puede darle el precio si no utiliz Tourist information centre manager.  - Puede imprimir el cupn correspondiente y llevarlo con su receta a la farmacia.  -  Tambin puede pasar por nuestra oficina durante el horario de atencin regular y Education officer, museum una tarjeta de cupones de GoodRx.  - Si necesita que su receta se enve electrnicamente a una farmacia diferente, informe a nuestra oficina a travs de MyChart de [facility] o por telfono llamando al [phone] y presione la opcin 4.

## 2023-11-23 NOTE — Progress Notes (Signed)
   Follow Up Visit   Subjective  George Rodriguez is a 33 y.o. male who presents for the following: Rash of the eyes L > R, comes and goes x 2 months. He wakes up with a thick crust on his L eyelid every morning. Left eye is watering a lot and is painful. He has seasonal allergies and takes allergy meds. He also has asthma. He had a telehealth visit 4/1 and was prescribed a Rx allergy eye drop and antibiotic eye ointment and drops. Patient has been on Dupixent for 5 years with no previous problems with eye irritation.    The following portions of the chart were reviewed this encounter and updated as appropriate: medications, allergies, medical history  Review of Systems:  No other skin or systemic complaints except as noted in HPI or Assessment and Plan.  Objective  Well appearing patient in no apparent distress; mood and affect are within normal limits.  A focused examination was performed of the following areas: face  Relevant exam findings are noted in the Assessment and Plan.    Assessment & Plan  Atopic Dermatitis with recent left eye irritation. Patient has seasonal allergies and is on Dupixent injections. Likely due to seasonal allergies/possible infection, doubt from Dupixent injections since unilateral and no prior h/o eye symptoms from Dupixent.  Exam: Periocular erythema with scale bilaterally; left lower eyelid with edema/erythema and mild crusting. Patient reports increased watering and tenderness of left eye.  Treatment Plan: Start Opzelura Cream to skin around eyes twice a day to treat dermatitis. Sample given. Start tobramycin opthalmic solution - apply 1-2 drops to left eye twice daily x 10 days Start doxycycline 100 mg take 1 po BID x 2 weeks with food dsp #28 0Rf. Doxycycline should be taken with food to prevent nausea. Do not lay down for 30 minutes after taking. Be cautious with sun exposure and use good sun protection while on this medication. Pregnant women should  not take this medication.  Continue Dupixent injections for now.  May need to d/c if he doesn't improve with treatment. Continue Xyzal 5 mg PO daily  Will send referral to [facility] for further evaluation.    Return as scheduled.  IBernardine Bridegroom, CMA, am acting as scribe for Artemio Larry, MD .   Documentation: I have reviewed the above documentation for accuracy and completeness, and I agree with the above.  Artemio Larry, MD

## 2023-12-01 ENCOUNTER — Telehealth: Payer: Self-pay

## 2023-12-01 NOTE — Telephone Encounter (Signed)
 [facility] left a message that they have tried contacting the patient four times, but have not been able to get in touch with him to schedule an appointment. I also tried contacting patient and the phone rang several times until message, "call cannot be completed as dialed".

## 2023-12-14 ENCOUNTER — Other Ambulatory Visit: Payer: Self-pay

## 2023-12-14 MED ORDER — DUPIXENT 300 MG/2ML ~~LOC~~ SOSY: 300.0000 mg | PREFILLED_SYRINGE | SUBCUTANEOUS | 0 refills | Status: DC

## 2023-12-14 NOTE — Progress Notes (Signed)
 90 day supply request. aw

## 2023-12-16 ENCOUNTER — Encounter: Payer: Self-pay | Admitting: Family Medicine

## 2024-02-22 ENCOUNTER — Other Ambulatory Visit: Payer: Self-pay | Admitting: Dermatology

## 2024-02-28 ENCOUNTER — Ambulatory Visit: Payer: 59 | Admitting: Dermatology

## 2024-03-02 ENCOUNTER — Encounter: Admitting: Family Medicine

## 2024-03-05 ENCOUNTER — Encounter: Payer: Self-pay | Admitting: Family Medicine

## 2024-03-14 ENCOUNTER — Ambulatory Visit: Admitting: Dermatology

## 2024-03-15 ENCOUNTER — Other Ambulatory Visit: Payer: Self-pay | Admitting: Family Medicine

## 2024-03-16 ENCOUNTER — Other Ambulatory Visit: Payer: Self-pay | Admitting: Dermatology

## 2024-03-16 NOTE — Telephone Encounter (Signed)
 Courtesy refill given, appointment needed, noted to return around 02/26/24, appointments canceled.   Requested Prescriptions  Pending Prescriptions Disp Refills   lisinopril  (ZESTRIL ) 20 MG tablet [Pharmacy Med Name: LISINOPRIL  20 MG TABLET] 30 tablet 0    Sig: Take 1 tablet (20 mg total) by mouth daily. OFFICE VISIT NEEDED FOR ADDITIONAL REFILLS     Cardiovascular:  ACE Inhibitors Failed - 03/16/2024 11:19 PM      Failed - Cr in normal range and within 180 days    Creatinine, Ser  Date Value Ref Range Status  08/29/2023 0.93 0.76 - 1.27 mg/dL Final         Failed - K in normal range and within 180 days    Potassium  Date Value Ref Range Status  08/29/2023 4.7 3.5 - 5.2 mmol/L Final         Failed - Valid encounter within last 6 months    Recent Outpatient Visits   None     Future Appointments             In 1 month Jackquline Sawyer, MD  [facility]            Passed - Patient is not pregnant      Passed - Last BP in normal range    BP Readings from Last 1 Encounters:  08/29/23 131/76

## 2024-04-02 ENCOUNTER — Other Ambulatory Visit: Payer: Self-pay | Admitting: Dermatology

## 2024-04-21 ENCOUNTER — Other Ambulatory Visit: Payer: Self-pay | Admitting: Family Medicine

## 2024-04-24 ENCOUNTER — Ambulatory Visit: Admitting: Dermatology

## 2024-04-24 NOTE — Telephone Encounter (Signed)
 Requested medications are due for refill today.  yes  Requested medications are on the active medications list.  yes  Last refill. 03/16/2024 #30 0 rf  Future visit scheduled.   no  Notes to clinic.  Pt already given a courtesy refill. Labs are expired.    Requested Prescriptions  Pending Prescriptions Disp Refills   lisinopril  (ZESTRIL ) 20 MG tablet [Pharmacy Med Name: LISINOPRIL  20 MG TABLET] 90 tablet 1    Sig: Take 1 tablet (20 mg total) by mouth daily. OFFICE VISIT NEEDED FOR ADDITIONAL REFILLS     Cardiovascular:  ACE Inhibitors Failed - 04/24/2024  8:57 AM      Failed - Cr in normal range and within 180 days    Creatinine, Ser  Date Value Ref Range Status  08/29/2023 0.93 0.76 - 1.27 mg/dL Final         Failed - K in normal range and within 180 days    Potassium  Date Value Ref Range Status  08/29/2023 4.7 3.5 - 5.2 mmol/L Final         Failed - Valid encounter within last 6 months    Recent Outpatient Visits   None     Future Appointments             Today Jackquline Sawyer, MD [facility]            Passed - Patient is not pregnant      Passed - Last BP in normal range    BP Readings from Last 1 Encounters:  08/29/23 131/76

## 2024-04-24 NOTE — Telephone Encounter (Signed)
 Needs appointment

## 2024-04-25 ENCOUNTER — Other Ambulatory Visit: Payer: Self-pay | Admitting: Family Medicine

## 2024-04-26 NOTE — Telephone Encounter (Signed)
 Requested medications are due for refill today.  yes  Requested medications are on the active medications list.  yes  Last refill. 03/16/2024 #30 0 rf  Future visit scheduled.   no  Notes to clinic.  Pt last seen 08/29/2023.  Pt already given a courtesy refill.    Requested Prescriptions  Pending Prescriptions Disp Refills   lisinopril  (ZESTRIL ) 20 MG tablet [Pharmacy Med Name: LISINOPRIL  20 MG TABLET] 30 tablet 0    Sig: Take 1 tablet (20 mg total) by mouth daily. OFFICE VISIT NEEDED FOR ADDITIONAL REFILLS     Cardiovascular:  ACE Inhibitors Failed - 04/26/2024 11:47 AM      Failed - Cr in normal range and within 180 days    Creatinine, Ser  Date Value Ref Range Status  08/29/2023 0.93 0.76 - 1.27 mg/dL Final         Failed - K in normal range and within 180 days    Potassium  Date Value Ref Range Status  08/29/2023 4.7 3.5 - 5.2 mmol/L Final         Failed - Valid encounter within last 6 months    Recent Outpatient Visits   None     Future Appointments             In 1 month Jackquline Sawyer, MD [facility]            Passed - Patient is not pregnant      Passed - Last BP in normal range    BP Readings from Last 1 Encounters:  08/29/23 131/76

## 2024-04-27 NOTE — Telephone Encounter (Signed)
 Called patient and left a message for him to call back to get scheduled for overdue OV

## 2024-04-27 NOTE — Telephone Encounter (Signed)
 Called patient x2 to schedule overdue ov. Phone rings then recording saying call can not be completed as dialed

## 2024-04-27 NOTE — Telephone Encounter (Signed)
 Admin, Please assist patient with much needed appointment and then forward to provider for possible refill.  Thank you!

## 2024-04-30 NOTE — Telephone Encounter (Signed)
 3rd Attempt to reach patient at [phone]. Call would not go through.

## 2024-04-30 NOTE — Telephone Encounter (Signed)
 Sent mychart message to patient

## 2024-05-30 ENCOUNTER — Ambulatory Visit: Admitting: Dermatology

## 2024-06-11 ENCOUNTER — Other Ambulatory Visit: Payer: Self-pay | Admitting: Dermatology

## 2024-06-11 ENCOUNTER — Other Ambulatory Visit (HOSPITAL_COMMUNITY): Payer: Self-pay

## 2024-06-20 ENCOUNTER — Ambulatory Visit: Admitting: Dermatology

## 2024-06-20 DIAGNOSIS — B351 Tinea unguium: Secondary | ICD-10-CM

## 2024-06-20 DIAGNOSIS — L2089 Other atopic dermatitis: Secondary | ICD-10-CM

## 2024-06-20 DIAGNOSIS — B353 Tinea pedis: Secondary | ICD-10-CM

## 2024-06-20 DIAGNOSIS — Z79899 Other long term (current) drug therapy: Secondary | ICD-10-CM | POA: Diagnosis not present

## 2024-06-20 MED ORDER — CICLOPIROX OLAMINE 0.77 % EX CREA: TOPICAL_CREAM | CUTANEOUS | 0 refills | Status: AC

## 2024-06-20 MED ORDER — VTAMA 1 % EX CREA: TOPICAL_CREAM | CUTANEOUS | 5 refills | Status: DC

## 2024-06-20 MED ORDER — CLOBETASOL PROPIONATE 0.05 % EX FOAM: CUTANEOUS | 4 refills | Status: AC

## 2024-06-20 MED ORDER — DUPIXENT 300 MG/2ML ~~LOC~~ SOSY: 300.0000 mg | PREFILLED_SYRINGE | SUBCUTANEOUS | 1 refills | Status: AC

## 2024-06-20 MED ORDER — TAVABOROLE 5 % EX SOLN: CUTANEOUS | 1 refills | Status: DC

## 2024-06-20 MED ORDER — VTAMA 1 % EX CREA
TOPICAL_CREAM | CUTANEOUS | 5 refills | Status: DC
Start: 2024-06-20 — End: 2024-06-20

## 2024-06-20 NOTE — Progress Notes (Signed)
 Follow-Up Visit   Subjective  George Rodriguez is a 33 y.o. male who presents for the following: Atopic Dermatitis, on Dupixent . Patient is well-controlled and rarely has to use topicals. Eye irritation cleared about 2 weeks after last appointment, he thinks it was from allergies. He also has rash on foot with abnormal toenail.  Was on Lamisil  tabs in past which helped.   The following portions of the chart were reviewed this encounter and updated as appropriate: medications, allergies, medical history  Review of Systems:  No other skin or systemic complaints except as noted in HPI or Assessment and Plan.  Objective  Well appearing patient in no apparent distress; mood and affect are within normal limits.  Areas Examined: Face, arms, back  Relevant physical exam findings are noted in the Assessment and Plan.    Assessment & Plan   TINEA UNGUIUM   Related Medications Tavaborole 5 % SOLN Apply to toenails nightly. OTHER ATOPIC DERMATITIS   Related Medications Clobetasol  Propionate Emulsion 0.05 % topical foam Apply BID to affected areas on body up to 2 weeks as needed. Avoid applying to face, groin, and axilla. clobetasol  (OLUX ) 0.05 % topical foam Apply topically as directed. Daily to bid up to 2 weeks to more severe areas of eczema on body, avoid face, groin, axilla dupilumab  (DUPIXENT ) 300 MG/2ML prefilled syringe Inject 300 mg into the skin every 14 (fourteen) days. Tapinarof (VTAMA) 1 % CREA Apply 2 grams once daily to affected areas of skin   ATOPIC DERMATITIS Exam: lichenification and mild erythema at the bilateral antecubitum; large hyperpigmented lichenified plaque at the right lower back; erythema and mild scaling eyebrows, periocular, forehead. 6% BSA  Chronic and persistent condition with duration or expected duration over one year. Condition is symptomatic/ bothersome to patient. Not currently at goal, but improved on Dupixent .    Atopic dermatitis -  Severe, on Dupixent  (biologic medication).  Atopic dermatitis (eczema) is a chronic, relapsing, pruritic condition that can significantly affect quality of life. It is often associated with allergic rhinitis and/or asthma and can require treatment with topical medications, phototherapy, or in severe cases biologic medications, which require long term medication management.    Treatment Plan: Start Vtama cream to aa psoriasis once daily. Ok to use on face. Samples x 3 given.    Continue clobetasol  foam or Sernivo  spray 1-2 times daily as needed for flares to aas body. Avoid applying to face, groin, and axilla. Use as directed. Long-term use can cause thinning of the skin.   Topical steroids (such as triamcinolone , fluocinolone, fluocinonide, mometasone, clobetasol , halobetasol , betamethasone , hydrocortisone) can cause thinning and lightening of the skin if they are used for too long in the same area. Your physician has selected the right strength medicine for your problem and area affected on the body. Please use your medication only as directed by your physician to prevent side effects.    Continue Dupixent  300 mg/2mL SQ QOW. Patient denies side effects. Apply Vtama once a day to affected areas of psoriasis.     Potential side effects include allergic reaction, herpes infections, injection site reactions and conjunctivitis (inflammation of the eyes).  The use of Dupixent  requires long term medication management, including periodic office visits.    Long term medication management.  Patient is using long term (months to years) prescription medication  to control their dermatologic condition.  These medications require periodic monitoring to evaluate for efficacy and side effects and may require periodic laboratory monitoring.   Recommend  gentle skin care.  TINEA UNGUIUM/PEDIS Exam: Thickened great toenail with yellow discoloration medial; erythema and hyperkeratosis of the R medial plantar  foot  Chronic and persistent condition with duration or expected duration over one year. Condition is symptomatic/ bothersome to patient. Not currently at goal.  Treatment Plan: Discussed terbinafine  if baseline labs ok. Patient prefers to start with topicals.  Start ciclopirox cream twice daily for 2-4 weeks.  If not improving after 2 weeks, switch to Sernivo  spray or Vtama cream qd.   Start Kerydin solution to toenails nightly. Sent to [facility] to use with GoodRx card.    Return in about 6 months (around 12/18/2024) for Atopic Dermatitis.  IAndrea Kerns, CMA, am acting as scribe for Rexene Rattler, MD .   Documentation: I have reviewed the above documentation for accuracy and completeness, and I agree with the above.  Rexene Rattler, MD

## 2024-06-20 NOTE — Patient Instructions (Addendum)
 Ciclopirox cream - apply to feet twice daily x 2 weeks. If improving, continue another 2 weeks. If no improvement, after 2 weeks, switch to Vtama Cream once daily.  Tavaborole solution - use GoodRx card at [facility] since not covered by insurance. Apply to toenails nightly.  Due to recent changes in healthcare laws, you may see results of your pathology and/or laboratory studies on MyChart before the doctors have had a chance to review them. We understand that in some cases there may be results that are confusing or concerning to you. Please understand that not all results are received at the same time and often the doctors may need to interpret multiple results in order to provide you with the best plan of care or course of treatment. Therefore, we ask that you please give us  2 business days to thoroughly review all your results before contacting the office for clarification. Should we see a critical lab result, you will be contacted sooner.   If You Need Anything After Your Visit  If you have any questions or concerns for your doctor, please call our main line at [phone] and press option 4 to reach your doctor's medical assistant. If no one answers, please leave a voicemail as directed and we will return your call as soon as possible. Messages left after 4 pm will be answered the following business day.   You may also send us  a message via MyChart. We typically respond to MyChart messages within 1-2 business days.  For prescription refills, please ask your pharmacy to contact our office. Our fax number is [phone].  If you have an urgent issue when the clinic is closed that cannot wait until the next business day, you can page your doctor at the number below.    Please note that while we do our best to be available for urgent issues outside of office hours, we are not available 24/7.   If you have an urgent issue and are unable to reach us , you may choose to seek medical care at  your doctor's office, retail clinic, urgent care center, or emergency room.  If you have a medical emergency, please immediately call 911 or go to the emergency department.  Pager Numbers  - Dr. Hester: [phone]  - Dr. Jackquline: [phone]  - Dr. Claudene: [phone]   - Dr. Raymund: [phone]  In the event of inclement weather, please call our main line at [phone] for an update on the status of any delays or closures.  Dermatology Medication Tips: Please keep the boxes that topical medications come in in order to help keep track of the instructions about where and how to use these. Pharmacies typically print the medication instructions only on the boxes and not directly on the medication tubes.   If your medication is too expensive, please contact our office at [phone] option 4 or send us  a message through MyChart.   We are unable to tell what your co-pay for medications will be in advance as this is different depending on your insurance coverage. However, we may be able to find a substitute medication at lower cost or fill out paperwork to get insurance to cover a needed medication.   If a prior authorization is required to get your medication covered by your insurance company, please allow us  1-2 business days to complete this process.  Drug prices often vary depending on where the prescription is filled and some pharmacies may offer cheaper prices.  The website www.goodrx.com contains coupons  for medications through different pharmacies. The prices here do not account for what the cost may be with help from insurance (it may be cheaper with your insurance), but the website can give you the price if you did not use any insurance.  - You can print the associated coupon and take it with your prescription to the pharmacy.  - You may also stop by our office during regular business hours and pick up a GoodRx coupon card.  - If you need your prescription sent  electronically to a different pharmacy, notify our office through [facility] or by phone at [phone] option 4.     Si Usted Necesita Algo Despus de Su Visita  Tambin puede enviarnos un mensaje a travs de Clinical Cytogeneticist. Por lo general respondemos a los mensajes de MyChart en el transcurso de 1 a 2 das hbiles.  Para renovar recetas, por favor pida a su farmacia que se ponga en contacto con nuestra oficina. Randi lakes de fax es [place] [phone].  Si tiene un asunto urgente cuando la clnica est cerrada y que no puede esperar hasta el siguiente da hbil, puede llamar/localizar a su doctor(a) al nmero que aparece a continuacin.   Por favor, tenga en cuenta que aunque hacemos todo lo posible para estar disponibles para asuntos urgentes fuera del horario de [place], no estamos disponibles las 24 horas del da, los 7 [address]  [phone] de la [place].   Si tiene un problema urgente y no puede comunicarse con nosotros, puede optar por buscar atencin mdica  en el consultorio de su doctor(a), en una clnica privada, en un centro de atencin urgente o en una sala de emergencias.  Si tiene engineer, drilling, por favor llame inmediatamente al 911 o vaya a la sala de emergencias.  Nmeros de bper  - Dr. Hester: [phone]  - Dra. Jackquline: [phone]  - Dr. Claudene: [phone]  - Dra. Kitts: [phone]  En caso de inclemencias del [place], por favor llame a nuestra lnea principal al [phone] para una actualizacin sobre el estado de cualquier retraso o cierre.  Consejos para la medicacin en dermatologa: Por favor, guarde las cajas en las que vienen los medicamentos de uso tpico para ayudarle a seguir las instrucciones sobre dnde y cmo usarlos. Las farmacias generalmente imprimen las instrucciones del medicamento slo en las cajas y no directamente en los tubos del [place].   Si su medicamento es muy caro, por favor, pngase en contacto con landry rieger llamando al  [phone] y presione la opcin 4 o envenos un mensaje a travs de Clinical Cytogeneticist.   No podemos decirle cul ser su copago por los medicamentos por adelantado ya que esto es diferente dependiendo de la cobertura de su seguro. Sin embargo, es posible que podamos encontrar un medicamento sustituto a audiological scientist un formulario para que el seguro cubra el medicamento que se considera necesario.   Si se requiere una autorizacin previa para que su compaa de seguros cubra su medicamento, por favor permtanos de 1 a 2 das hbiles para completar este proceso.  Los precios de los medicamentos varan con frecuencia dependiendo del environmental consultant de dnde se surte la receta y alguna farmacias pueden ofrecer precios ms baratos.  El sitio web www.goodrx.com tiene cupones para medicamentos de health and safety inspector. Los precios aqu no tienen en cuenta lo que podra costar con la ayuda del seguro (puede ser ms barato con su seguro), pero el sitio web puede darle el precio si no utiliz tourist information centre manager.  -  Puede imprimir el cupn correspondiente y llevarlo con su receta a la farmacia.  - Tambin puede pasar por nuestra oficina durante el horario de atencin regular y education officer, museum una tarjeta de cupones de GoodRx.  - Si necesita que su receta se enve electrnicamente a una farmacia diferente, informe a nuestra oficina a travs de MyChart de [place] o por telfono llamando al [phone] y presione la opcin 4.

## 2024-08-17 ENCOUNTER — Encounter: Payer: Self-pay | Admitting: Family Medicine

## 2024-08-17 ENCOUNTER — Ambulatory Visit: Admitting: Family Medicine

## 2024-08-17 VITALS — BP 152/92 | HR 89 | Temp 97.6°F | Ht 71.5 in | Wt 270.8 lb

## 2024-08-17 DIAGNOSIS — Z789 Other specified health status: Secondary | ICD-10-CM | POA: Diagnosis not present

## 2024-08-17 DIAGNOSIS — I10 Essential (primary) hypertension: Secondary | ICD-10-CM | POA: Diagnosis not present

## 2024-08-17 DIAGNOSIS — R0683 Snoring: Secondary | ICD-10-CM | POA: Diagnosis not present

## 2024-08-17 MED ORDER — LOSARTAN POTASSIUM 50 MG PO TABS: 50.0000 mg | ORAL_TABLET | Freq: Every day | ORAL | 1 refills | Status: AC

## 2024-08-17 NOTE — Assessment & Plan Note (Signed)
 Was having a cough on lisinopril - has been off of it for 2 days. Will change to losartan  and recheck in about 6 weeks. Call with any concerns.

## 2024-08-17 NOTE — Progress Notes (Signed)
 "  BP (!) 152/92 (BP Location: Left Arm, Cuff Size: Large)   Pulse 89   Temp 97.6 F (36.4 C) (Oral)   Ht 5' 11.5 (1.816 m)   Wt 270 lb 12.8 oz (122.8 kg)   SpO2 98%   BMI 37.24 kg/m    Subjective:    Patient ID: George Rodriguez, male    DOB: 16-Aug-1990, 34 y.o.   MRN: [id number]  HPI: George Rodriguez is a 34 y.o. male  Chief Complaint  Patient presents with   Hypertension    Patient states he has been having issues with coughing with his Lisinopril . States he has not taken the medication since Sunday   Snoring    Patient states that his wife has been noticing very loud snoring, breathing issues while sleeping. States he also wakes up feeling unrested.    HYPERTENSION- was having issues with a cough starting about 6 months ago  Hypertension status: uncontrolled  Satisfied with current treatment? no Duration of hypertension: chronic BP monitoring frequency:  rarely BP medication side effects:  yes- cough Medication compliance: good compliance Previous BP meds:lisinopril  Aspirin: no Recurrent headaches: no Visual changes: no Palpitations: no Dyspnea: no Chest pain: no Lower extremity edema: no Dizzy/lightheaded: no  ???SLEEP APNEA Sleep apnea status: suspected Duration: chronic Satisfied with current treatment?:  no CPAP use:  no Last sleep study: never Wakes feeling refreshed:  no Daytime hypersomnolence:  yes Fatigue:  yes Insomnia:  no Good sleep hygiene:  yes Difficulty falling asleep:  no Difficulty staying asleep:  yes Snoring bothers bed partner:  yes Observed apnea by bed partner: yes Obesity:  yes Hypertension: yes  Pulmonary hypertension:  no Coronary artery disease:  no    Relevant past medical, surgical, family and social history reviewed and updated as indicated. Interim medical history since our last visit reviewed. Allergies and medications reviewed and updated.  Review of Systems  Constitutional:  Positive for fatigue. Negative for  activity change, appetite change, chills, diaphoresis, fever and unexpected weight change.  Respiratory: Negative.    Cardiovascular: Negative.   Gastrointestinal: Negative.   Musculoskeletal: Negative.   Psychiatric/Behavioral: Negative.      Per HPI unless specifically indicated above     Objective:    BP (!) 152/92 (BP Location: Left Arm, Cuff Size: Large)   Pulse 89   Temp 97.6 F (36.4 C) (Oral)   Ht 5' 11.5 (1.816 m)   Wt 270 lb 12.8 oz (122.8 kg)   SpO2 98%   BMI 37.24 kg/m   Wt Readings from Last 3 Encounters:  08/17/24 270 lb 12.8 oz (122.8 kg)  08/29/23 258 lb (117 kg)  06/17/23 252 lb 9.6 oz (114.6 kg)    Physical Exam Vitals and nursing note reviewed.  Constitutional:      General: He is not in acute distress.    Appearance: Normal appearance. He is obese. He is not ill-appearing, toxic-appearing or diaphoretic.  HENT:     Head: Normocephalic and atraumatic.     Right Ear: External ear normal.     Left Ear: External ear normal.     Nose: Nose normal.     Mouth/Throat:     Mouth: Mucous membranes are moist.     Pharynx: Oropharynx is clear.  Eyes:     General: No scleral icterus.       Right eye: No discharge.        Left eye: No discharge.     Extraocular Movements: Extraocular movements  intact.     Conjunctiva/sclera: Conjunctivae normal.     Pupils: Pupils are equal, round, and reactive to light.  Cardiovascular:     Rate and Rhythm: Normal rate and regular rhythm.     Pulses: Normal pulses.     Heart sounds: Normal heart sounds. No murmur heard.    No friction rub. No gallop.  Pulmonary:     Effort: Pulmonary effort is normal. No respiratory distress.     Breath sounds: Normal breath sounds. No stridor. No wheezing, rhonchi or rales.  Chest:     Chest wall: No tenderness.  Musculoskeletal:        General: Normal range of motion.     Cervical back: Normal range of motion and neck supple.  Skin:    General: Skin is warm and dry.      Capillary Refill: Capillary refill takes less than 2 seconds.     Coloration: Skin is not jaundiced or pale.     Findings: No bruising, erythema, lesion or rash.  Neurological:     General: No focal deficit present.     Mental Status: He is alert and oriented to person, place, and time. Mental status is at baseline.  Psychiatric:        Mood and Affect: Mood normal.        Behavior: Behavior normal.        Thought Content: Thought content normal.        Judgment: Judgment normal.     Results for orders placed or performed in visit on 08/29/23  Basic metabolic panel   Collection Time: 08/29/23  2:45 PM  Result Value Ref Range   Glucose 79 70 - 99 mg/dL   BUN 9 6 - 20 mg/dL   Creatinine, Ser 9.06 0.76 - 1.27 mg/dL   eGFR 886 >40 fO/fpw/8.26   BUN/Creatinine Ratio 10 9 - 20   Sodium 143 134 - 144 mmol/L   Potassium 4.7 3.5 - 5.2 mmol/L   Chloride 102 96 - 106 mmol/L   CO2 24 20 - 29 mmol/L   Calcium  9.9 8.7 - 10.2 mg/dL      Assessment & Plan:   Problem List Items Addressed This Visit       Cardiovascular and Mediastinum   Primary hypertension - Primary   Was having a cough on lisinopril - has been off of it for 2 days. Will change to losartan  and recheck in about 6 weeks. Call with any concerns.       Relevant Medications   losartan  (COZAAR ) 50 MG tablet   Other Relevant Orders   Basic metabolic panel with GFR   Other Visit Diagnoses       Snoring       Concern for OSA- will check sleep study. Await results.   Relevant Orders   Ambulatory referral to Sleep Studies     Hepatitis B vaccination status unknown       Labs drawn today. Await results.   Relevant Orders   Hepatitis B surface antibody,quantitative        Follow up plan: Return in about 6 weeks (around 09/28/2024).      "

## 2024-08-18 LAB — BASIC METABOLIC PANEL WITH GFR
BUN/Creatinine Ratio: 12 (ref 9–20)
BUN: 10 mg/dL (ref 6–20)
CO2: 20 mmol/L (ref 20–29)
Calcium: 9.5 mg/dL (ref 8.7–10.2)
Chloride: 107 mmol/L — ABNORMAL HIGH (ref 96–106)
Creatinine, Ser: 0.86 mg/dL (ref 0.76–1.27)
Glucose: 88 mg/dL (ref 70–99)
Potassium: 4.2 mmol/L (ref 3.5–5.2)
Sodium: 141 mmol/L (ref 134–144)
eGFR: 118 mL/min/1.73

## 2024-08-18 LAB — HEPATITIS B SURFACE ANTIBODY, QUANTITATIVE: Hepatitis B Surf Ab Quant: <3.5 m[IU]/mL — ABNORMAL LOW

## 2024-08-20 ENCOUNTER — Ambulatory Visit: Payer: Self-pay | Admitting: Family Medicine

## 2024-09-07 ENCOUNTER — Encounter: Payer: Self-pay | Admitting: Family Medicine

## 2024-09-07 ENCOUNTER — Ambulatory Visit: Payer: Self-pay | Admitting: Family Medicine

## 2024-09-07 ENCOUNTER — Telehealth: Admitting: Family Medicine

## 2024-09-07 VITALS — Wt 271.0 lb

## 2024-09-07 DIAGNOSIS — F4321 Adjustment disorder with depressed mood: Secondary | ICD-10-CM

## 2024-09-07 MED ORDER — LORAZEPAM 0.5 MG PO TABS: 0.5000 mg | ORAL_TABLET | Freq: Two times a day (BID) | ORAL | 0 refills | Status: AC | PRN

## 2024-09-07 NOTE — Telephone Encounter (Signed)
 Scheduled
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
## 2024-09-07 NOTE — Telephone Encounter (Signed)
 Appt. OK to double book today- I can see him now virtually if he'd like
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
## 2024-09-09 ENCOUNTER — Other Ambulatory Visit: Payer: Self-pay | Admitting: Family Medicine

## 2024-10-02 ENCOUNTER — Encounter: Admitting: Family Medicine

## 2024-12-18 ENCOUNTER — Ambulatory Visit: Admitting: Dermatology
